# Patient Record
Sex: Male | Born: 1955 | Race: Black or African American | Hispanic: No | Marital: Married | Smoking: Former smoker
Health system: Southern US, Community
[De-identification: ages and names within clinical notes are randomized; demographics above are authoritative.]

---

## 1990-10-31 HISTORY — PX: CARPAL TUNNEL RELEASE: SHX101

## 2008-01-28 ENCOUNTER — Ambulatory Visit (HOSPITAL_COMMUNITY): Admission: RE | Admit: 2008-01-28 | Discharge: 2008-01-28 | Payer: Self-pay | Admitting: *Deleted

## 2008-02-21 ENCOUNTER — Emergency Department (HOSPITAL_COMMUNITY): Admission: EM | Admit: 2008-02-21 | Discharge: 2008-02-21 | Payer: Self-pay | Admitting: Emergency Medicine

## 2008-08-08 ENCOUNTER — Emergency Department (HOSPITAL_COMMUNITY): Admission: EM | Admit: 2008-08-08 | Discharge: 2008-08-08 | Payer: Self-pay | Admitting: Emergency Medicine

## 2008-08-10 ENCOUNTER — Emergency Department (HOSPITAL_COMMUNITY): Admission: EM | Admit: 2008-08-10 | Discharge: 2008-08-10 | Payer: Self-pay | Admitting: Emergency Medicine

## 2008-08-17 ENCOUNTER — Emergency Department (HOSPITAL_COMMUNITY): Admission: EM | Admit: 2008-08-17 | Discharge: 2008-08-17 | Payer: Self-pay | Admitting: Emergency Medicine

## 2010-10-06 ENCOUNTER — Ambulatory Visit: Payer: Self-pay | Admitting: Internal Medicine

## 2011-03-08 ENCOUNTER — Inpatient Hospital Stay (INDEPENDENT_AMBULATORY_CARE_PROVIDER_SITE_OTHER)
Admission: RE | Admit: 2011-03-08 | Discharge: 2011-03-08 | Disposition: A | Discharge status: Home / Self Care | Payer: 59 | Source: Ambulatory Visit | Attending: Family Medicine | Admitting: Family Medicine

## 2011-03-08 DIAGNOSIS — J019 Acute sinusitis, unspecified: Secondary | ICD-10-CM

## 2011-03-15 NOTE — Op Note (Signed)
NAME:  Larry Heath, Larry Heath                 ACCOUNT NO.:  [id number]   MEDICAL RECORD NO.:  [id number]          PATIENT TYPE:  AMB   LOCATION:  ENDO                         FACILITY:  [facility]   PHYSICIAN:  Georgiana Spinner, M.D.    DATE OF BIRTH:  Jan 24, 1956   DATE OF PROCEDURE:  01/28/2008  DATE OF DISCHARGE:                               OPERATIVE REPORT   PROCEDURE:  Colonoscopy.   INDICATIONS:  Colon cancer screening.   ANESTHESIA:  Fentanyl 100 mcg, Versed 10 mg.   DESCRIPTION OF THE PROCEDURE:  With the patient mildly sedated in the  left lateral decubitus position the Pentax videoscopic colonoscope was  inserted in the rectum after normal rectal exam and passed under direct  vision to the cecum identified by the ileocecal valve and appendiceal  orifice both of which were photographed.  From this point, the scope was  slowly withdrawn taking circumferential views of colonic mucosa stopping  only in the rectum which appeared normal on direct and showed small  hemorrhoids on retroflexed view.  The endoscope was straightened and  withdrawn.  The patient's vital signs and pulse oximeter remained  stable.  The patient tolerated the procedure well without apparent  complications.   FINDINGS:  Internal hemorrhoids, otherwise unremarkable exam.   PLAN:  Consider repeat examination in 5-10 years.           ______________________________  Georgiana Spinner, M.D.     GMO/MEDQ  D:  01/28/2008  T:  01/28/2008  Job:  [id number]

## 2011-07-26 LAB — CK TOTAL AND CKMB (NOT AT ARMC): Total CK: 222

## 2011-07-26 LAB — POCT CARDIAC MARKERS
Myoglobin, poc: 37.9
Myoglobin, poc: 44.7
Myoglobin, poc: 46.2
Operator id: 282201
Operator id: 285841
Troponin i, poc: <0.05

## 2011-07-26 LAB — POCT I-STAT, CHEM 8
Hemoglobin: 15.3
Sodium: 139
TCO2: 26

## 2011-11-13 ENCOUNTER — Ambulatory Visit (INDEPENDENT_AMBULATORY_CARE_PROVIDER_SITE_OTHER): Payer: 59

## 2011-11-13 DIAGNOSIS — M940 Chondrocostal junction syndrome [Tietze]: Secondary | ICD-10-CM

## 2012-05-23 ENCOUNTER — Encounter (HOSPITAL_COMMUNITY): Payer: Self-pay | Admitting: *Deleted

## 2012-05-23 ENCOUNTER — Emergency Department (HOSPITAL_COMMUNITY)
Admission: EM | Admit: 2012-05-23 | Discharge: 2012-05-23 | Disposition: A | Discharge status: Home / Self Care | Payer: 59 | Attending: Emergency Medicine | Admitting: Emergency Medicine

## 2012-05-23 ENCOUNTER — Emergency Department (HOSPITAL_COMMUNITY): Payer: 59

## 2012-05-23 DIAGNOSIS — M79609 Pain in unspecified limb: Secondary | ICD-10-CM

## 2012-05-23 DIAGNOSIS — M25569 Pain in unspecified knee: Secondary | ICD-10-CM | POA: Insufficient documentation

## 2012-05-23 DIAGNOSIS — M25461 Effusion, right knee: Secondary | ICD-10-CM

## 2012-05-23 DIAGNOSIS — M25469 Effusion, unspecified knee: Secondary | ICD-10-CM | POA: Insufficient documentation

## 2012-05-23 DIAGNOSIS — M7989 Other specified soft tissue disorders: Secondary | ICD-10-CM

## 2012-05-23 MED ORDER — TRAMADOL HCL 50 MG PO TABS: 50.0000 mg | ORAL_TABLET | Freq: Four times a day (QID) | ORAL | Status: AC | PRN

## 2012-05-23 MED ORDER — IBUPROFEN 600 MG PO TABS: 600.0000 mg | ORAL_TABLET | Freq: Four times a day (QID) | ORAL | Status: AC | PRN

## 2012-05-23 NOTE — Progress Notes (Signed)
*  PRELIMINARY RESULTS* Vascular Ultrasound Right lower extremity venous duplex has been completed.  Preliminary findings: Right= No evidence of DVT. A loculated baker's cyst composed of mixed echoes is seen in the popliteal fossa, and it appears to have a small leak into the proximal calf.  Riley Lam, Jaclyn Carew RDMS 05/23/2012, 9:14 AM

## 2012-05-23 NOTE — ED Provider Notes (Signed)
History     CSN: [id number]  Arrival date & time 05/23/12  1478   First MD Initiated Contact with Patient 05/23/12 0818      Chief Complaint  Patient presents with  . Leg Pain    (Consider location/radiation/quality/duration/timing/severity/associated sxs/prior treatment) HPI  56 year old male presents complaining of pain to right leg. Sts for the past 2 days he has been noticing swelling to the back of his R knee along with intermittent sharp, achy pain to R knee/thigh.  Pain only lasting seconds, worsening with walking and stretching, and resolved on its own.  Does not recall any recent trauma or prior injury to same area.  Does admits to having long trip travel to [place] 1 week ago.  Denies cp, sob, hemoptysis or rash.  Denies weakness or numbness.  No prior hx of DVT.  Has tried applying heat compress last night with minimal improvement.  Pt does admits to play rec basketball.    No past medical history on file.  No past surgical history on file.  No family history on file.  History  Substance Use Topics  . Smoking status: Not on file  . Smokeless tobacco: Not on file  . Alcohol Use: Not on file      Review of Systems  Constitutional: Negative for fever.  Respiratory: Negative for cough and shortness of breath.   Musculoskeletal: Positive for joint swelling.  Skin: Negative for rash.  Neurological: Negative for numbness.  All other systems reviewed and are negative.    Allergies  Review of patient's allergies indicates not on file.  Home Medications  No current outpatient prescriptions on file.  There were no vitals taken for this visit.  Physical Exam  Nursing note and vitals reviewed. Constitutional: He appears well-developed and well-nourished. No distress.  HENT:  Head: Atraumatic.  Eyes: Conjunctivae are normal.  Neck: Neck supple.  Cardiovascular: Normal rate and regular rhythm.   Pulmonary/Chest: Effort normal and breath sounds normal. No  respiratory distress. He has no wheezes.  Musculoskeletal: Normal range of motion. He exhibits no edema.       Right hip: Normal.       Right knee: He exhibits normal range of motion, no swelling, no effusion and no erythema. tenderness found.       Legs:      No palpable cords, erythema, or rash noted bilaterally to lower extremities  Neurological: He is alert.  Skin: Skin is warm. No rash noted.    ED Course  Procedures (including critical care time)  Labs Reviewed - No data to display  Dg Knee Complete 4 Views Right  05/23/2012  *RADIOLOGY REPORT*  Clinical Data: Leg pain.  RIGHT KNEE - COMPLETE 4+ VIEW  Comparison: No priors.  Findings: There is a suprapatellar effusion.  No acute fracture, subluxation or dislocation.  Mild chondrocalcinosis is noted, most pronounced in the medial meniscus.  Enthesophytes are noted on the patella.  IMPRESSION: 1.  Suprapatellar effusion, without evidence of underlying bony trauma. 2.  Mild chondrocalcinosis.  Original Report Authenticated By: Florencia Reasons, M.D.    Shashank, Kwasnik  Male  1956/05/13  [id number]            Progress Notes signed by Glendale Chard at 05/23/12 6578     Author:  Glendale Chard  Service:  Vascular Lab  Author Type:  Cardiovascular Sonographer   Filed:  05/23/12 0916  Note Time:  05/23/12 0914          *  PRELIMINARY RESULTS*  Vascular Ultrasound  Right lower extremity venous duplex has been completed. Preliminary findings: Right= No evidence of DVT. A loculated baker's cyst composed of mixed echoes is seen in the popliteal fossa, and it appears to have a small leak into the proximal calf.  EUNICE, JILL RDMS  05/23/2012, 9:14 AM     1. Right knee effusion  MDM  Vague tenderness to R posterior fossa.  Recent long trip.  Plan to obtain xray to evaluate structure, and vascular study to r/o DVT.    No evidence of infection, dislocation, fb.     9:19 AM The study shows evidence of a Baker's cyst but no evidence  of DVT.  Xray of R knee shows suprapatellar effusion with no acute fracture.  My anticipation is pt may have injured his R knee while playing basketball. Will give knee sleeve, and f/u with ortho for further care.  Pain medication prescribe, RICE therapy discussed.       Fayrene Helper, PA-C 05/23/12 0930

## 2012-05-23 NOTE — ED Notes (Signed)
Venous doppler in progress.

## 2012-05-23 NOTE — ED Notes (Signed)
Pt reports pain behind his RLE right below his knee, and presents with a swollen area on the side of his R knee as well.  Pt ambulated to the room with a limp. Pt reports pain only when ambulating.  Pt reports applying heat compress to area last night.  Pt also reports driving to [place] about a week ago.  Pt reports noticing the pain Sunday.  Pt denies any SOB or cp at this time.

## 2012-05-23 NOTE — ED Provider Notes (Signed)
Medical screening examination/treatment/procedure(s) were performed by non-physician practitioner and as supervising physician I was immediately available for consultation/collaboration.   Susie Ehresman A Johathon Overturf, MD 05/23/12 1515 

## 2015-07-07 ENCOUNTER — Emergency Department (HOSPITAL_COMMUNITY): Payer: Self-pay

## 2015-07-07 ENCOUNTER — Encounter (HOSPITAL_COMMUNITY): Payer: Self-pay | Admitting: Emergency Medicine

## 2015-07-07 ENCOUNTER — Emergency Department (HOSPITAL_COMMUNITY)
Admission: EM | Admit: 2015-07-07 | Discharge: 2015-07-07 | Disposition: A | Discharge status: Home / Self Care | Payer: Self-pay | Attending: Emergency Medicine | Admitting: Emergency Medicine

## 2015-07-07 DIAGNOSIS — R160 Hepatomegaly, not elsewhere classified: Secondary | ICD-10-CM | POA: Insufficient documentation

## 2015-07-07 DIAGNOSIS — K625 Hemorrhage of anus and rectum: Secondary | ICD-10-CM | POA: Insufficient documentation

## 2015-07-07 DIAGNOSIS — K644 Residual hemorrhoidal skin tags: Secondary | ICD-10-CM | POA: Insufficient documentation

## 2015-07-07 DIAGNOSIS — Z79899 Other long term (current) drug therapy: Secondary | ICD-10-CM | POA: Insufficient documentation

## 2015-07-07 DIAGNOSIS — K5900 Constipation, unspecified: Secondary | ICD-10-CM | POA: Insufficient documentation

## 2015-07-07 DIAGNOSIS — R1010 Upper abdominal pain, unspecified: Secondary | ICD-10-CM

## 2015-07-07 LAB — COMPREHENSIVE METABOLIC PANEL
ALBUMIN: 4.2 g/dL (ref 3.5–5.0)
ALK PHOS: 83 U/L (ref 38–126)
ALT: 16 U/L — ABNORMAL LOW (ref 17–63)
AST: 18 U/L (ref 15–41)
Anion gap: 8 (ref 5–15)
BILIRUBIN TOTAL: 0.8 mg/dL (ref 0.3–1.2)
BUN: 10 mg/dL (ref 6–20)
CALCIUM: 9.2 mg/dL (ref 8.9–10.3)
CO2: 27 mmol/L (ref 22–32)
Chloride: 98 mmol/L — ABNORMAL LOW (ref 101–111)
Creatinine, Ser: 1.04 mg/dL (ref 0.61–1.24)
GFR calc Af Amer: >60 mL/min (ref >60)
GLUCOSE: 142 mg/dL — AB (ref 65–99)
Potassium: 3.9 mmol/L (ref 3.5–5.1)
Sodium: 133 mmol/L — ABNORMAL LOW (ref 135–145)
TOTAL PROTEIN: 7.8 g/dL (ref 6.5–8.1)

## 2015-07-07 LAB — CBC WITH DIFFERENTIAL/PLATELET
BASOS PCT: 0 % (ref 0–1)
Basophils Absolute: 0 10*3/uL (ref 0.0–0.1)
EOS ABS: 0.1 10*3/uL (ref 0.0–0.7)
EOS PCT: 1 % (ref 0–5)
HCT: 42.2 % (ref 39.0–52.0)
Hemoglobin: 14.1 g/dL (ref 13.0–17.0)
Lymphocytes Relative: 23 % (ref 12–46)
Lymphs Abs: 1.4 10*3/uL (ref 0.7–4.0)
MCH: 31.3 pg (ref 26.0–34.0)
MCHC: 33.4 g/dL (ref 30.0–36.0)
MCV: 93.8 fL (ref 78.0–100.0)
MONO ABS: 0.6 10*3/uL (ref 0.1–1.0)
MONOS PCT: 9 % (ref 3–12)
Neutro Abs: 4.2 10*3/uL (ref 1.7–7.7)
Neutrophils Relative %: 67 % (ref 43–77)
PLATELETS: 252 10*3/uL (ref 150–400)
RBC: 4.5 MIL/uL (ref 4.22–5.81)
RDW: 11.7 % (ref 11.5–15.5)
WBC: 6.3 10*3/uL (ref 4.0–10.5)

## 2015-07-07 LAB — POC OCCULT BLOOD, ED: FECAL OCCULT BLD: NEGATIVE

## 2015-07-07 LAB — LIPASE, BLOOD: Lipase: 18 U/L — ABNORMAL LOW (ref 22–51)

## 2015-07-07 MED ORDER — PANTOPRAZOLE SODIUM 40 MG PO TBEC: 40.0000 mg | DELAYED_RELEASE_TABLET | Freq: Two times a day (BID) | ORAL | Status: DC

## 2015-07-07 MED ORDER — SODIUM CHLORIDE 0.9 % IV BOLUS (SEPSIS)
1000.0000 mL | Freq: Once | INTRAVENOUS | Status: AC
  Administered 2015-07-07: 1000 mL via INTRAVENOUS

## 2015-07-07 NOTE — Discharge Instructions (Signed)
The Ultrasound shows a mass in your liver. This could be from "fatty liver" or could be a cancerous mass. You need an MRI of your abdomen to tell for sure. Call your Primary Care Physician as soon as possible to set this up

## 2015-07-07 NOTE — ED Notes (Signed)
Pt reports abdominal pain (mid, right upper) starting Wednesday. Says pain is "constant, throbbing and sharp." Still has gallbladder. Denies N/V/D/fever. Says his stools have been "like little drops recently" which is abnormal and there has been some blood noted in stool. Has hemorrhoids. No other c/c.

## 2015-07-07 NOTE — ED Provider Notes (Signed)
CSN: [id number]     Arrival date & time 07/07/15  0865 History   First MD Initiated Contact with Patient 07/07/15 [id number]     Chief Complaint  Patient presents with  . Abdominal Pain  . Blood In Stools     (Consider location/radiation/quality/duration/timing/severity/associated sxs/prior Treatment) HPI  59 year old male presents with 5 days of upper abdominal pain. States it started shortly after eating 5 days ago and has since been constant. Pain is right upper quadrant and epigastric. Pain is a constant throbbing and aching but gets worse about 30 minutes after eating. No matter what he eats or drinks makes it worse. Has not had any diarrhea but has been having small stools. He thought it was gas so he started on magnesium citrate but states there is no help in his pain. Then started taking kaopectate and since then the pain has significantly improved. Rates pain as a 1/10 now. No nausea, vomiting, or fevers. No back pain or urinary symptoms. Has a history of hemorrhoids. No rectal pain, but does endorse "drops of blood" in the toilet after BMs since onset of his pain. Drinks about 3 16 oz beers daily. Last had a colonoscopy 7 years ago and was told it was normal.  History reviewed. No pertinent past medical history. History reviewed. No pertinent past surgical history. History reviewed. No pertinent family history. Social History  Substance Use Topics  . Smoking status: Never Smoker   . Smokeless tobacco: None  . Alcohol Use: Yes     Comment: occa    Review of Systems  Constitutional: Negative for fever.  Gastrointestinal: Positive for abdominal pain, constipation and blood in stool. Negative for nausea, vomiting and diarrhea.  Musculoskeletal: Negative for back pain.  All other systems reviewed and are negative.     Allergies  Review of patient's allergies indicates no known allergies.  Home Medications   Prior to Admission medications   Medication Sig Start Date End Date  Taking? Authorizing Provider  bismuth subsalicylate (PEPTO BISMOL) 262 MG/15ML suspension Take 60 mLs by mouth every 6 (six) hours as needed for indigestion.   Yes Historical Provider, MD  calcium carbonate (TUMS - DOSED IN MG ELEMENTAL CALCIUM) 500 MG chewable tablet Chew 1 tablet by mouth daily.   Yes Historical Provider, MD  ranitidine (ZANTAC) 75 MG tablet Take 75 mg by mouth 2 (two) times daily.   Yes Historical Provider, MD   BP 151/92 mmHg  Pulse 112  Temp(Src) 98.3 F (36.8 C) (Oral)  Resp 20  SpO2 97% Physical Exam  Constitutional: He is oriented to person, place, and time. He appears well-developed and well-nourished.  HENT:  Head: Normocephalic and atraumatic.  Right Ear: External ear normal.  Left Ear: External ear normal.  Nose: Nose normal.  Eyes: Right eye exhibits no discharge. Left eye exhibits no discharge.  Neck: Neck supple.  Cardiovascular: Normal rate, regular rhythm, normal heart sounds and intact distal pulses.   Pulmonary/Chest: Effort normal and breath sounds normal.  Abdominal: Soft. There is tenderness in the right upper quadrant and epigastric area. There is negative Murphy's sign.  Genitourinary: Rectal exam shows external hemorrhoid (small nonthrombosed hemorrhoids). Rectal exam shows no internal hemorrhoid, no tenderness and anal tone normal. Guaiac negative stool.  Musculoskeletal: He exhibits no edema.  Neurological: He is alert and oriented to person, place, and time.  Skin: Skin is warm and dry.  Nursing note and vitals reviewed.   ED Course  Procedures (including critical care time) Labs Review  Labs Reviewed  COMPREHENSIVE METABOLIC PANEL - Abnormal; Notable for the following:    Sodium 133 (*)    Chloride 98 (*)    Glucose, Bld 142 (*)    ALT 16 (*)    All other components within normal limits  LIPASE, BLOOD - Abnormal; Notable for the following:    Lipase 18 (*)    All other components within normal limits  CBC WITH  DIFFERENTIAL/PLATELET  POC OCCULT BLOOD, ED    Imaging Review US Abdomen Limited  07/07/2015   CLINICAL DATA:  Right upper quadrant pain, epigastric pain  EXAM: US ABDOMEN LIMITED - RIGHT UPPER QUADRANT  COMPARISON:  None.  FINDINGS: Gallbladder:  No gallstones or wall thickening visualized. No sonographic Murphy sign noted.  Common bile duct:  Diameter: 3.3 mm  Liver:  There is a 4.3 x 2.5 x 3.2 cm hypoechoic masslike area in the left hepatic lobe. Increased hepatic parenchymal echogenicity.  IMPRESSION: 1. No cholelithiasis or sonographic evidence of acute cholecystitis. 2. Increased hepatic echogenicity as can be seen with hepatic steatosis. 4.3 x 2.5 x 3.2 cm hypoechoic masslike area in the left hepatic lobe which may reflect an area of focal fatty sparing versus a true hepatic mass. Further evaluation with a nonemergent MRI of the abdomen is recommended for more definitive characterization.   Electronically Signed   By: Elige Ko   On: 07/07/2015 08:49   I have personally reviewed and evaluated these images and lab results as part of my medical decision-making.   EKG Interpretation None      MDM   Final diagnoses:  Upper abdominal pain  Liver mass  BRBPR (bright red blood per rectum)    Patient with only mild upper abdominal pain at this time. Most likely is from a gastric source such as gastritis or an ulcer. He does have bloody stools but no anemia and no rectal bleeding on my exam. Tachycardic on triage but on recheck his heart rate is in the 80s. He has no signs of significant dehydration. Ultrasound shows no gallbladder etiology but does show a hypoechoic mass. I discussed the possible etiologies with the patient and stressed the importance of getting an MRI of his abdomen as an outpatient with his PCP. Will start on PPI and discussed return precautions.    Pricilla Loveless, MD 07/07/15 [phone]

## 2016-06-03 IMAGING — US US ABDOMEN LIMITED
1 series · 14 of 25 positions shown · non-contrast
Comparison: None.

CLINICAL DATA: Right upper quadrant pain, epigastric pain

EXAM:
US ABDOMEN LIMITED - RIGHT UPPER QUADRANT

[Series 1: us abdomen limited · 0.25mm/px · 14 of 115 slices shown]
[im 1/115]
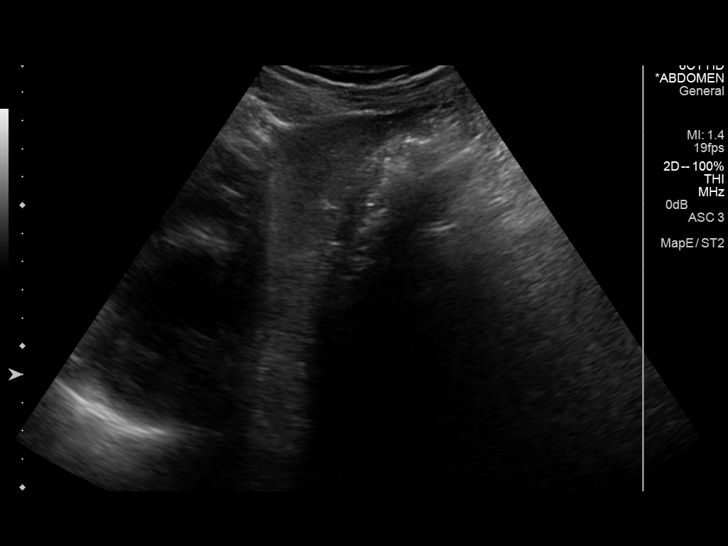
[im 10/115]
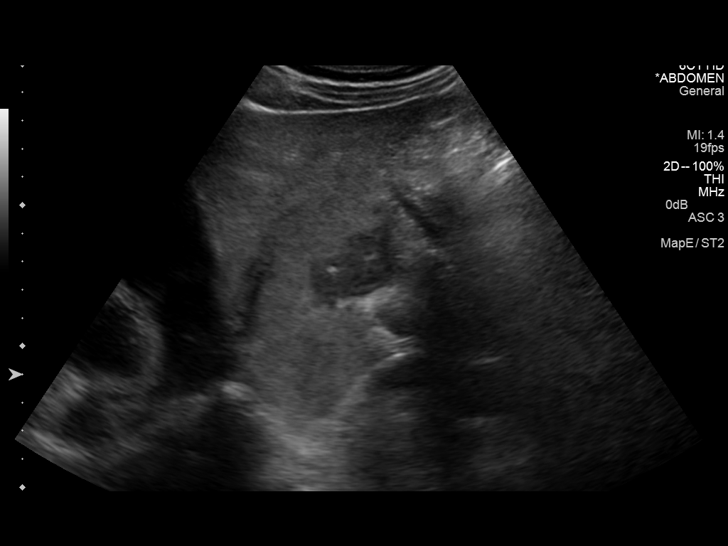
[im 20/115]
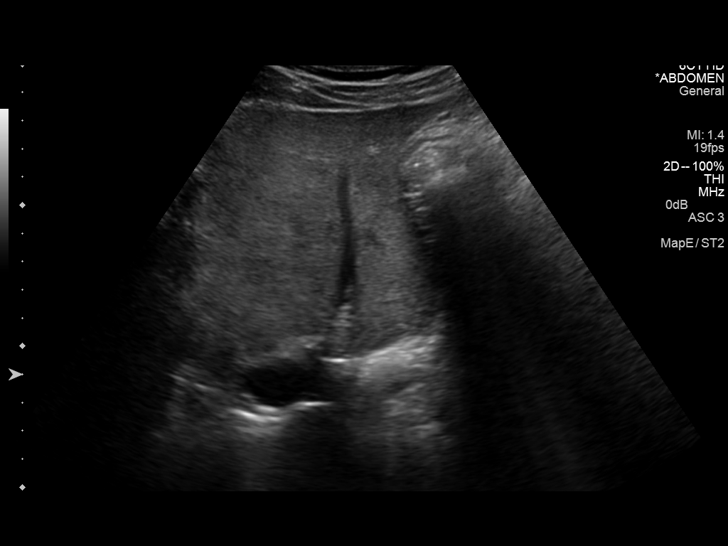
[im 29/115]
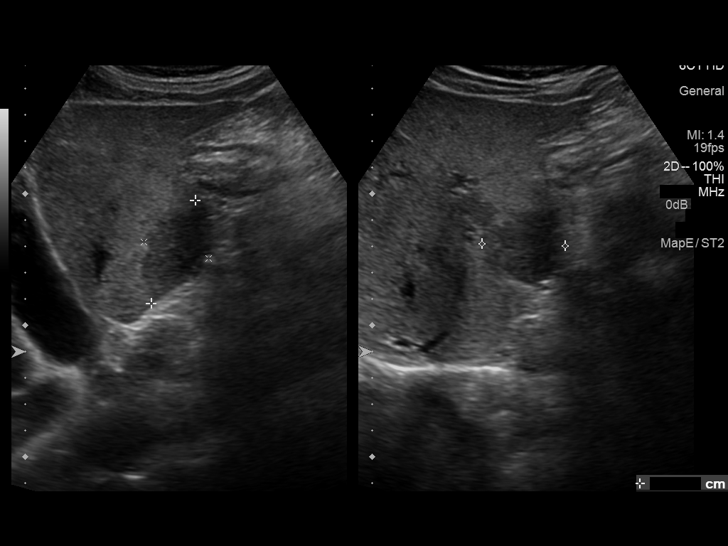
[im 39/115]
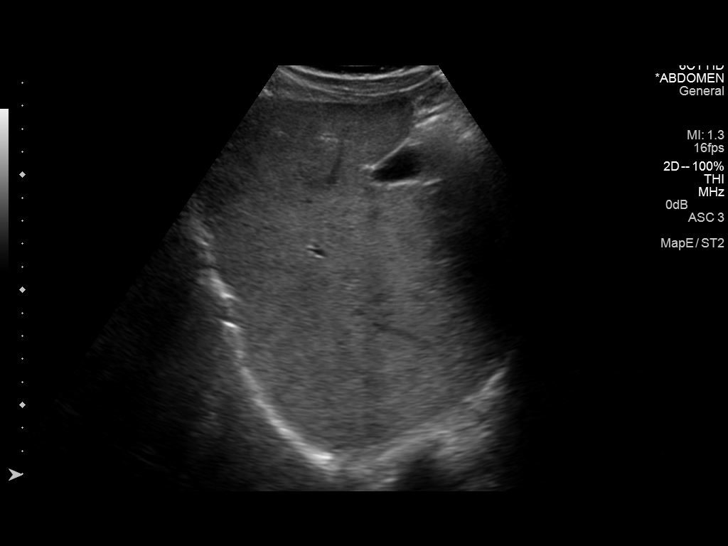
[im 43/115]
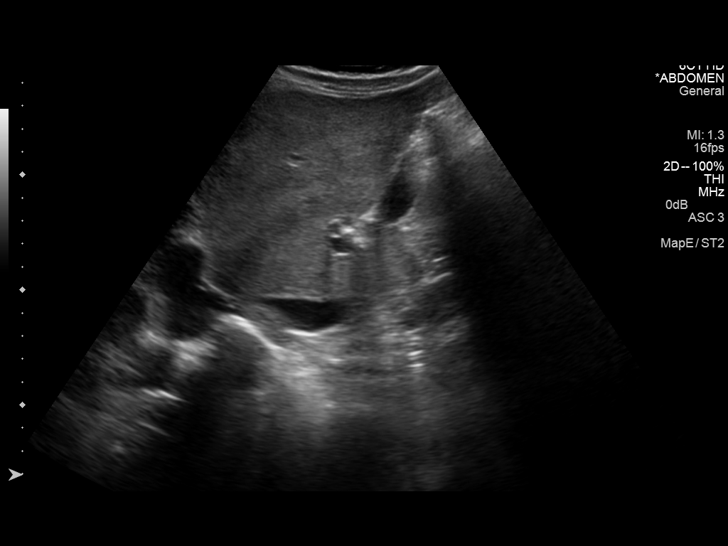
[im 53/115]
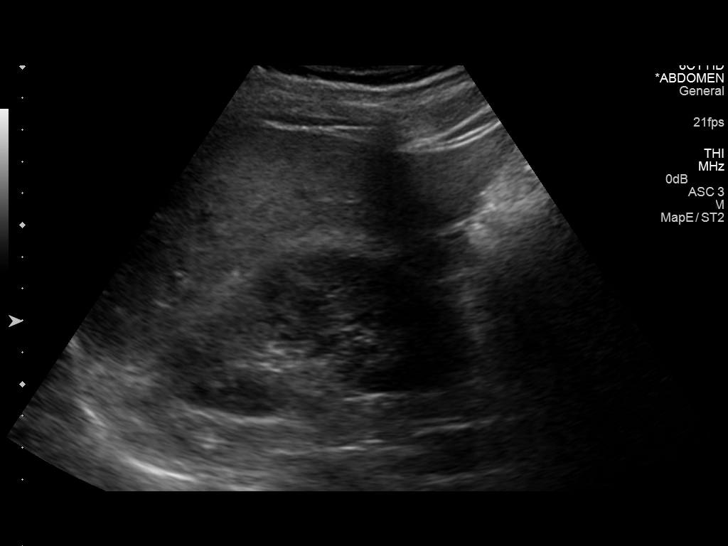
[im 62/115]
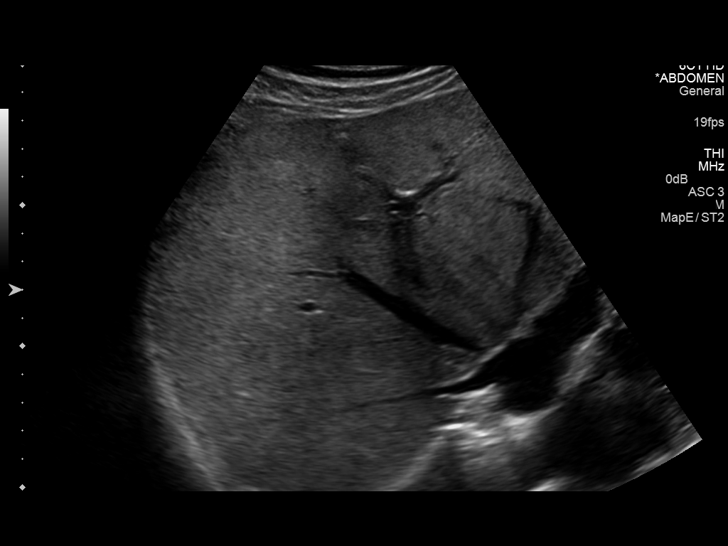
[im 72/115]
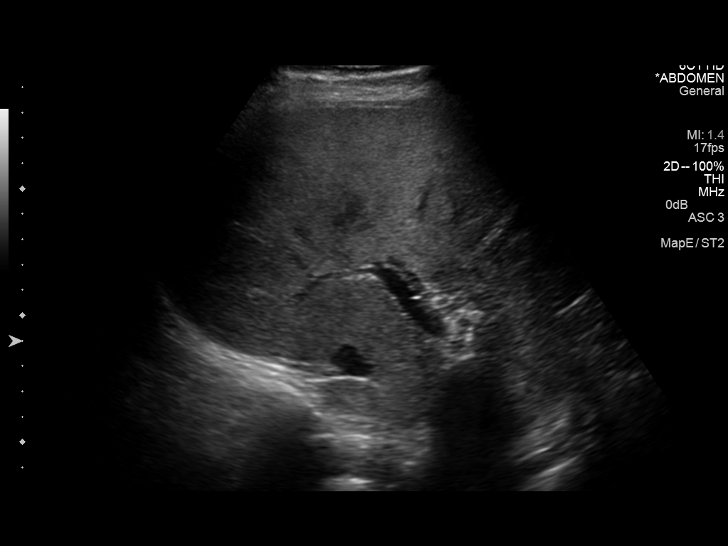
[im 77/115]
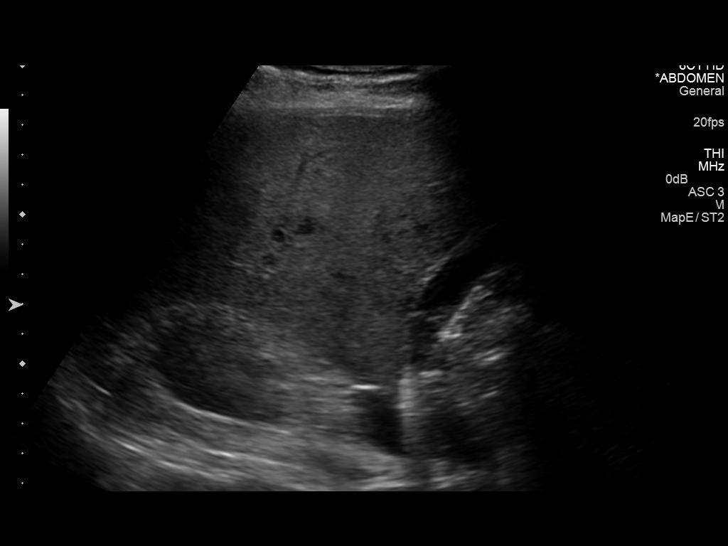
[im 86/115]
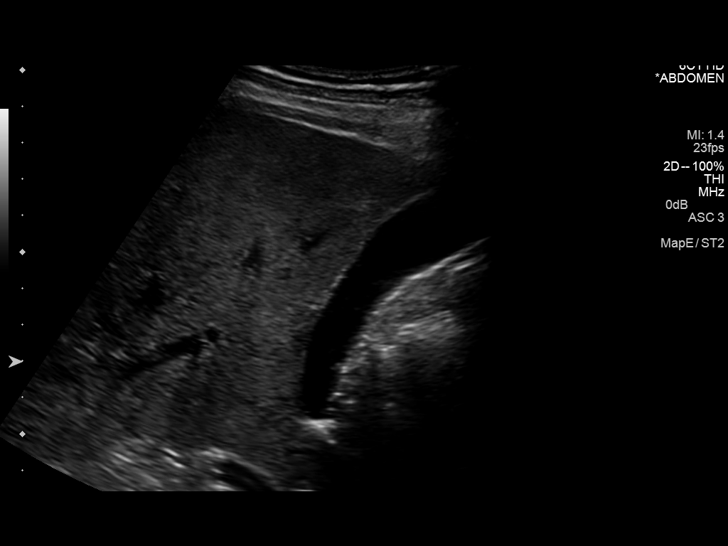
[im 96/115]
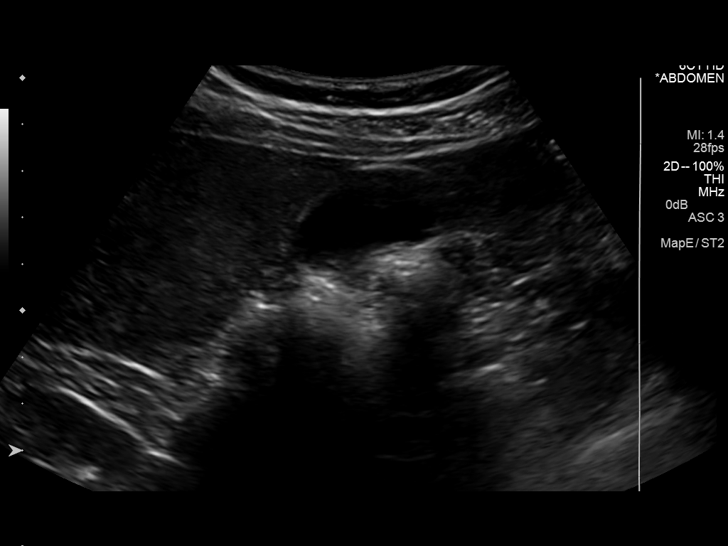
[im 105/115]
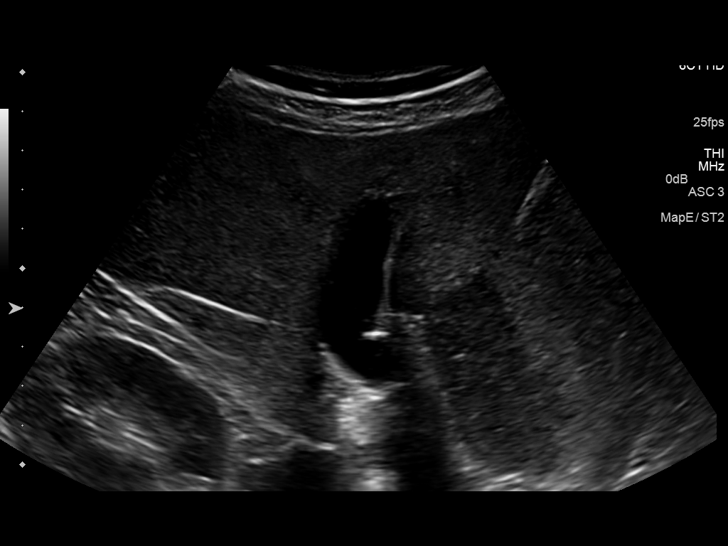
[im 115/115]
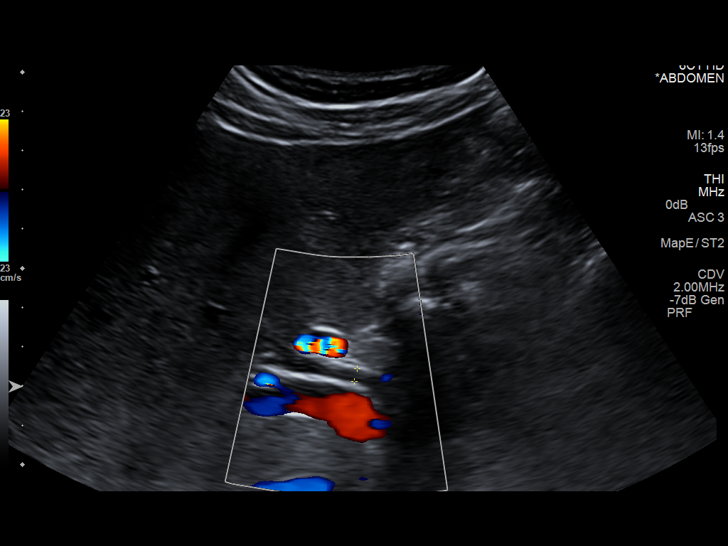

[14 of 25 positions shown; findings below may reference images not displayed]

FINDINGS: Gallbladder:

No gallstones or wall thickening visualized. No sonographic Murphy
sign noted.

Common bile duct:

Diameter: 3.3 mm

Liver:

There is a 4.3 x 2.5 x 3.2 cm hypoechoic masslike area in the left
hepatic lobe. Increased hepatic parenchymal echogenicity.
IMPRESSION: 1. No cholelithiasis or sonographic evidence of acute cholecystitis.
2. Increased hepatic echogenicity as can be seen with hepatic
steatosis. 4.3 x 2.5 x 3.2 cm hypoechoic masslike area in the left
hepatic lobe which may reflect an area of focal fatty sparing versus
a true hepatic mass. Further evaluation with a nonemergent MRI of
the abdomen is recommended for more definitive characterization.

## 2017-04-12 ENCOUNTER — Encounter: Payer: Self-pay | Admitting: Physician Assistant

## 2017-04-12 ENCOUNTER — Ambulatory Visit (INDEPENDENT_AMBULATORY_CARE_PROVIDER_SITE_OTHER): Payer: PRIVATE HEALTH INSURANCE | Admitting: Physician Assistant

## 2017-04-12 VITALS — BP 176/98 | HR 91 | Temp 98.1°F | Resp 18 | Ht 62.6 in | Wt 158.4 lb

## 2017-04-12 DIAGNOSIS — Z6827 Body mass index (BMI) 27.0-27.9, adult: Secondary | ICD-10-CM | POA: Insufficient documentation

## 2017-04-12 DIAGNOSIS — D72819 Decreased white blood cell count, unspecified: Secondary | ICD-10-CM

## 2017-04-12 DIAGNOSIS — R300 Dysuria: Secondary | ICD-10-CM | POA: Diagnosis not present

## 2017-04-12 LAB — POCT URINALYSIS DIP (MANUAL ENTRY)
BILIRUBIN UA: NEGATIVE mg/dL
Glucose, UA: NEGATIVE mg/dL
LEUKOCYTES UA: NEGATIVE
Nitrite, UA: NEGATIVE
SPEC GRAV UA: 1.02 (ref 1.010–1.025)
Urobilinogen, UA: 1 E.U./dL
pH, UA: 6 (ref 5.0–8.0)

## 2017-04-12 LAB — POCT CBC
GRANULOCYTE PERCENT: 44.8 % (ref 37–80)
HEMATOCRIT: 42.9 % — AB (ref 43.5–53.7)
Hemoglobin: 14.7 g/dL (ref 14.1–18.1)
LYMPH, POC: 1.3 (ref 0.6–3.4)
MCH, POC: 32.2 pg — AB (ref 27–31.2)
MCHC: 34.3 g/dL (ref 31.8–35.4)
MCV: 94.1 fL (ref 80–97)
MID (cbc): 0.1 (ref 0–0.9)
MPV: 7.6 fL (ref 0–99.8)
POC GRANULOCYTE: 1.2 — AB (ref 2–6.9)
POC LYMPH %: 51.6 % — AB (ref 10–50)
POC MID %: 3.6 % (ref 0–12)
Platelet Count, POC: 225 10*3/uL (ref 142–424)
RBC: 4.56 M/uL — AB (ref 4.69–6.13)
RDW, POC: 12.8 %
WBC: 2.6 10*3/uL — AB (ref 4.6–10.2)

## 2017-04-12 LAB — POC MICROSCOPIC URINALYSIS (UMFC): MUCUS RE: ABSENT

## 2017-04-12 MED ORDER — SULFAMETHOXAZOLE-TRIMETHOPRIM 800-160 MG PO TABS: 1.0000 | ORAL_TABLET | Freq: Two times a day (BID) | ORAL | 0 refills | Status: DC

## 2017-04-12 NOTE — Progress Notes (Signed)
Subjective:    Patient ID: Larry Heath, male    DOB: [birth year], 61 y.o.   MRN: [id number] PCP: Georgianne Fickamachandran, Ajith, MD   HPI : 61 y/o M presents today for a possible urinary tract infection.  His wife recently changed detergents and then had bumps on the side of his penile shaft. This occurs occasionally when the detergent is changed. He described the bumps as clusters of little "pimple like bumps". They usually break open and then he uses antibiotic cream until they resolve. About 1 month ago this occurred and he used the antibacterial cream and it took about 1.5 weeks for the bumps to resolve. About 1 week ago he developed intermittent dysuria. It mostly occurs in the morning when he wakes up. He is also having night sweats, but denies fever or chills. He had some nausea yesterday, without vomiting, diarrhea, or constipation. He denies flank and back pain. He denies any suprapubic pain. The dysuria has not gotten any better or any worse since it began. He denies hematuria, penile pain, penile discharge.  He is only having sex with his wife.    Also having some sinus problems now. Has had them his whole life. Postnasal drip. Taking an OTC [employer] brand of claritin D.    There are no active problems to display for this patient.  No past medical history on file. Prior to Admission medications   Medication Sig Start Date End Date Taking? Authorizing Provider  bismuth subsalicylate (PEPTO BISMOL) 262 MG/15ML suspension Take 60 mLs by mouth every 6 (six) hours as needed for indigestion.    [provider]  calcium carbonate (TUMS - DOSED IN MG ELEMENTAL CALCIUM) 500 MG chewable tablet Chew 1 tablet by mouth daily.    [provider]  pantoprazole (PROTONIX) 40 MG tablet Take 1 tablet (40 mg total) by mouth 2 (two) times daily. 07/07/15   Pricilla LovelessGoldston, Scott, MD  ranitidine (ZANTAC) 75 MG tablet Take 75 mg by mouth 2 (two) times daily.    [provider]   No Known  Allergies    Review of Systems  Constitutional: Positive for diaphoresis. Negative for appetite change, chills and fever.  HENT: Positive for congestion and postnasal drip.   Eyes: Negative.  Negative for visual disturbance.  Respiratory: Negative.  Negative for cough, chest tightness and shortness of breath.   Cardiovascular: Negative.  Negative for chest pain and palpitations.  Gastrointestinal: Positive for nausea. Negative for abdominal pain, blood in stool, constipation, diarrhea and vomiting.  Endocrine: Negative.  Negative for polyuria.  Genitourinary: Positive for dysuria. Negative for decreased urine volume, difficulty urinating, discharge, flank pain, frequency, hematuria, penile pain, penile swelling, scrotal swelling, testicular pain and urgency.  Musculoskeletal: Negative for back pain.  Skin: Negative.   Allergic/Immunologic: Positive for environmental allergies.  Neurological: Negative for dizziness, weakness, light-headedness and headaches.  Hematological: Negative.   Psychiatric/Behavioral: Positive for sleep disturbance. Suicidal ideas: night sweats.       Objective:   Physical Exam  Constitutional: He is oriented to person, place, and time. He appears well-developed and well-nourished. No distress.  BP (!) 174/102   Pulse 91   Temp 98.1 F (36.7 C) (Oral)   Resp 18   Ht 5' 2.6" (1.59 m)   Wt 158 lb 6.4 oz (71.8 kg)   SpO2 96%   BMI 28.42 kg/m  BP: (!) 176/98     HENT:  Head: Normocephalic and atraumatic.  Eyes: Conjunctivae and EOM are normal. Pupils are equal,  round, and reactive to light.  Neck: Normal range of motion. Neck supple.  Cardiovascular: Normal rate, regular rhythm, normal heart sounds and intact distal pulses.   Pulmonary/Chest: Effort normal and breath sounds normal.  Abdominal: Soft. He exhibits no distension and no mass. There is no tenderness. There is no rebound and no guarding.  Lymphadenopathy:    He has no cervical adenopathy.    Neurological: He is alert and oriented to person, place, and time.  Skin: Skin is warm and dry. He is not diaphoretic.  Psychiatric: He has a normal mood and affect. His behavior is normal. Judgment and thought content normal.   Results for orders placed or performed in visit on 04/12/17 (from the past 24 hour(s))  POCT urinalysis dipstick     Status: Abnormal   Collection Time: 04/12/17  9:24 AM  Result Value Ref Range   Color, UA yellow yellow   Clarity, UA cloudy (A) clear   Glucose, UA negative negative mg/dL   Bilirubin, UA small (A) negative   Ketones, POC UA negative negative mg/dL   Spec Grav, UA 6.962 9.528 - 1.025   Blood, UA moderate (A) negative   pH, UA 6.0 5.0 - 8.0   Protein Ur, POC trace (A) negative mg/dL   Urobilinogen, UA 1.0 0.2 or 1.0 E.U./dL   Nitrite, UA Negative Negative   Leukocytes, UA Negative Negative  POCT Microscopic Urinalysis ([facility])     Status: Abnormal   Collection Time: 04/12/17  9:37 AM  Result Value Ref Range   WBC,UR,HPF,POC None None WBC/hpf   RBC,UR,HPF,POC Few (A) None RBC/hpf   Bacteria Few (A) None, Too numerous to count   Mucus Absent Absent   Epithelial Cells, UR Per Microscopy Few (A) None, Too numerous to count cells/hpf  POCT CBC     Status: Abnormal   Collection Time: 04/12/17  9:38 AM  Result Value Ref Range   WBC 2.6 (A) 4.6 - 10.2 K/uL   Lymph, poc 1.3 0.6 - 3.4   POC LYMPH PERCENT 51.6 (A) 10 - 50 %L   MID (cbc) 0.1 0 - 0.9   POC MID % 3.6 0 - 12 %M   POC Granulocyte 1.2 (A) 2 - 6.9   Granulocyte percent 44.8 37 - 80 %G   RBC 4.56 (A) 4.69 - 6.13 M/uL   Hemoglobin 14.7 14.1 - 18.1 g/dL   HCT, POC 41.3 (A) 24.4 - 53.7 %   MCV 94.1 80 - 97 fL   MCH, POC 32.2 (A) 27 - 31.2 pg   MCHC 34.3 31.8 - 35.4 g/dL   RDW, POC 01.0 %   Platelet Count, POC 225 142 - 424 K/uL   MPV 7.6 0 - 99.8 fL         Assessment & Plan:  1. Dysuria Possibly due to prostatitis. Treat empirically with Bactrim. Pending culture results, will  definitively know and will adjust treatment as needed. Also possibly due to HSV or other virus causing urinary symptoms. At physical exam in 2 weeks will evaluate for HSV antibody via blood.  - POCT urinalysis dipstick - POCT Microscopic Urinalysis ([facility]) - Urine Culture - POCT CBC - sulfamethoxazole-trimethoprim (BACTRIM DS,SEPTRA DS) 800-160 MG tablet; Take 1 tablet by mouth 2 (two) times daily.  Dispense: 20 tablet; Refill: 0  2. Leukopenia, unspecified type Likely due to recent or current viral illness. Will recheck CBC at next visit in 2 weeks.   Return in about 2 weeks (around 04/26/2017) for Wellness exam  and follow-up.

## 2017-04-12 NOTE — Progress Notes (Signed)
Patient ID: Larry Heath, male     DOB: August 06, 1956, 61 y.o.    MRN: [id number]  PCP: System, Pcp Not In  Chief Complaint  Patient presents with  . Groin Pain    believes he has a bacterial infection x 3 weeks     Subjective:   This patient is new to me and presents for evaluation of possible UTI. He is accompanied by his wife.  Approximately 1 week ago, he developed intermittent dysuria, usually in the mornings upon waking. No nausea yesterday. Some nocturnal sweating, but no other fever, chills. No back pain, abdominal or flank pain. No increased urinary urgency or frequency. No hematuria. No penile discharge. No lesions of or around the genitalia, though he notes that he gets recurrent bumps on the penile shaft, always in the same location, and usually following a change in detergent or soap. They usually resolve in about 1 week. Last episode was about 1 month ago, and seemed to take a little longer to resolve. Sexually active only with his wife.  Review of Systems As above. Chronic post-nasal drainage associated with allergies. No CP, SOB, HA, dizziness.  Prior to Admission medications   Not on File     No Known Allergies   Patient Active Problem List   Diagnosis Date Noted  . BMI 28.0-28.9,adult 04/12/2017     Family History  Problem Relation Age of Onset  . Hyperlipidemia Mother   . Hypertension Father      Social History   Social History  . Marital status: Married    Spouse name: Deanna ArtisKeisha  . Number of children: 2  . Years of education: some college   Occupational History  . integration security    Social History Main Topics  . Smoking status: Never Smoker  . Smokeless tobacco: Never Used  . Alcohol use Yes     Comment: occa  . Drug use: No  . Sexual activity: Yes   Other Topics Concern  . Not on file   Social History Narrative   Former [employer].   Lives with his wife.   2 children from previous relationship are adults and live  independently.         Objective:  Physical Exam  Constitutional: He is oriented to person, place, and time. He appears well-developed and well-nourished. He is active and cooperative. No distress.  BP (!) 176/98   Pulse 91   Temp 98.1 F (36.7 C) (Oral)   Resp 18   Ht 5' 2.6" (1.59 m)   Wt 158 lb 6.4 oz (71.8 kg)   SpO2 96%   BMI 28.42 kg/m   HENT:  Head: Normocephalic and atraumatic.  Right Ear: Hearing normal.  Left Ear: Hearing normal.  Eyes: Conjunctivae are normal. No scleral icterus.  Neck: Normal range of motion. Neck supple. No thyromegaly present.  Cardiovascular: Normal rate, regular rhythm and normal heart sounds.   Pulses:      Radial pulses are 2+ on the right side, and 2+ on the left side.  Pulmonary/Chest: Effort normal and breath sounds normal.  Abdominal: Hernia confirmed negative in the right inguinal area and confirmed negative in the left inguinal area.  Genitourinary: Testes normal and penis normal. Circumcised.  Lymphadenopathy:       Head (right side): No tonsillar, no preauricular, no posterior auricular and no occipital adenopathy present.       Head (left side): No tonsillar, no preauricular, no posterior auricular and no occipital adenopathy present.  He has no cervical adenopathy.       Right: No inguinal and no supraclavicular adenopathy present.       Left: No inguinal and no supraclavicular adenopathy present.  Neurological: He is alert and oriented to person, place, and time. No sensory deficit.  Skin: Skin is warm, dry and intact. No rash noted. No cyanosis or erythema. Nails show no clubbing.  Psychiatric: He has a normal mood and affect. His speech is normal and behavior is normal.   Results for orders placed or performed in visit on 04/12/17  POCT urinalysis dipstick  Result Value Ref Range   Color, UA yellow yellow   Clarity, UA cloudy (A) clear   Glucose, UA negative negative mg/dL   Bilirubin, UA small (A) negative   Ketones,  POC UA negative negative mg/dL   Spec Grav, UA 1.610 9.604 - 1.025   Blood, UA moderate (A) negative   pH, UA 6.0 5.0 - 8.0   Protein Ur, POC trace (A) negative mg/dL   Urobilinogen, UA 1.0 0.2 or 1.0 E.U./dL   Nitrite, UA Negative Negative   Leukocytes, UA Negative Negative  POCT Microscopic Urinalysis ([facility])  Result Value Ref Range   WBC,UR,HPF,POC None None WBC/hpf   RBC,UR,HPF,POC Few (A) None RBC/hpf   Bacteria Few (A) None, Too numerous to count   Mucus Absent Absent   Epithelial Cells, UR Per Microscopy Few (A) None, Too numerous to count cells/hpf  POCT CBC  Result Value Ref Range   WBC 2.6 (A) 4.6 - 10.2 K/uL   Lymph, poc 1.3 0.6 - 3.4   POC LYMPH PERCENT 51.6 (A) 10 - 50 %L   MID (cbc) 0.1 0 - 0.9   POC MID % 3.6 0 - 12 %M   POC Granulocyte 1.2 (A) 2 - 6.9   Granulocyte percent 44.8 37 - 80 %G   RBC 4.56 (A) 4.69 - 6.13 M/uL   Hemoglobin 14.7 14.1 - 18.1 g/dL   HCT, POC 54.0 (A) 98.1 - 53.7 %   MCV 94.1 80 - 97 fL   MCH, POC 32.2 (A) 27 - 31.2 pg   MCHC 34.3 31.8 - 35.4 g/dL   RDW, POC 19.1 %   Platelet Count, POC 225 142 - 424 K/uL   MPV 7.6 0 - 99.8 fL       Assessment & Plan:  1. Dysuria Await UCx. No evidence of bacterial infection. Consider HSV. RTC next episode of penile lesions for additional evalaution. - POCT urinalysis dipstick - POCT Microscopic Urinalysis ([facility]) - Urine Culture - POCT CBC  2. Leukopenia, unspecified type Repeat at wellness visit.    Return in about 2 weeks (around 04/26/2017) for Wellness exam and follow-up.   Fernande Bras, PA-C Primary Care at [facility]

## 2017-04-12 NOTE — Patient Instructions (Signed)
     IF you received an x-ray today, you will receive an invoice from [facility]. Please contact [facility] at [phone] with questions or concerns regarding your invoice.   IF you received labwork today, you will receive an invoice from [facility]. Please contact [facility] at [phone] with questions or concerns regarding your invoice.   Our billing staff will not be able to assist you with questions regarding bills from these companies.  You will be contacted with the lab results as soon as they are available. The fastest way to get your results is to activate your My Chart account. Instructions are located on the last page of this paperwork. If you have not heard from us regarding the results in 2 weeks, please contact this office.     

## 2017-04-28 ENCOUNTER — Ambulatory Visit (INDEPENDENT_AMBULATORY_CARE_PROVIDER_SITE_OTHER): Payer: PRIVATE HEALTH INSURANCE | Admitting: Physician Assistant

## 2017-04-28 ENCOUNTER — Encounter: Payer: Self-pay | Admitting: Physician Assistant

## 2017-04-28 VITALS — BP 150/80 | HR 85 | Temp 98.0°F | Resp 18 | Ht 63.5 in | Wt 158.4 lb

## 2017-04-28 DIAGNOSIS — Z1211 Encounter for screening for malignant neoplasm of colon: Secondary | ICD-10-CM | POA: Diagnosis not present

## 2017-04-28 DIAGNOSIS — Z13220 Encounter for screening for lipoid disorders: Secondary | ICD-10-CM | POA: Diagnosis not present

## 2017-04-28 DIAGNOSIS — Z6827 Body mass index (BMI) 27.0-27.9, adult: Secondary | ICD-10-CM

## 2017-04-28 DIAGNOSIS — N489 Disorder of penis, unspecified: Secondary | ICD-10-CM | POA: Diagnosis not present

## 2017-04-28 DIAGNOSIS — Z125 Encounter for screening for malignant neoplasm of prostate: Secondary | ICD-10-CM

## 2017-04-28 DIAGNOSIS — Z23 Encounter for immunization: Secondary | ICD-10-CM | POA: Diagnosis not present

## 2017-04-28 DIAGNOSIS — Z0000 Encounter for general adult medical examination without abnormal findings: Secondary | ICD-10-CM

## 2017-04-28 DIAGNOSIS — Z13228 Encounter for screening for other metabolic disorders: Secondary | ICD-10-CM

## 2017-04-28 DIAGNOSIS — I10 Essential (primary) hypertension: Secondary | ICD-10-CM

## 2017-04-28 MED ORDER — AMLODIPINE BESYLATE 5 MG PO TABS: 5.0000 mg | ORAL_TABLET | Freq: Every day | ORAL | 3 refills | Status: AC

## 2017-04-28 NOTE — Progress Notes (Signed)
Patient ID: Larry Heath, male    DOB: [birth year], 61 y.o.   MRN: [id number]  PCP: System, Pcp Not In  Chief Complaint  Patient presents with  . Annual Exam    Subjective:   Presents for [employer].  Previous care through the [place]. Would like to start coming here. Seen earlier this month with dysuria, and related recurrent penile lesions. Would like HSV testing today.   Colorectal Cancer Screening: approximately 10 years ago, uncertain physician, possibly at [facility]. Prostate Cancer Screening: uncertain when last screened. Will obtain records from [place] system. Bone Density Testing: not yet HIV Screening: uncertain if ever screened.  STI Screening: very low risk Seasonal Influenza Vaccination: recommend annually Td/Tdap Vaccination: overdue. Pneumococcal Vaccination: not yet a candidate Zoster Vaccination: Not yet Frequency of Dental evaluation: infrequently Frequency of Eye evaluation: due for eye exam      Patient Active Problem List   Diagnosis Date Noted  . BMI 27.0-27.9,adult 04/12/2017    History reviewed. No pertinent past medical history.   Prior to Admission medications   Not on File    No Known Allergies  Past Surgical History:  Procedure Laterality Date  . CARPAL TUNNEL RELEASE Left 1992    Family History  Problem Relation Age of Onset  . Hyperlipidemia Mother   . Hypertension Mother   . Stroke Mother   . Hypertension Father   . Cancer Father 14       asbestos lung cancer    Social History   Social History  . Marital status: Married    Spouse name: Deanna Artis  . Number of children: 2  . Years of education: some college   Occupational History  . integration security    Social History Main Topics  . Smoking status: Former Smoker    Quit date: 04/28/1978  . Smokeless tobacco: Never Used  . Alcohol use Yes     Comment: 6 drinks per week  . Drug use: No  . Sexual activity: Yes   Other Topics Concern  . None   Social History  Narrative   Former Electronics engineer.   Lives with his wife.   2 children from previous relationship are adults and live independently.       Review of Systems  Constitutional: Negative.   HENT: Positive for sinus pressure ("hay fever"). Negative for congestion, dental problem, drooling, ear discharge, ear pain, facial swelling, hearing loss, mouth sores, nosebleeds, postnasal drip, rhinorrhea, sinus pain, sneezing, sore throat, tinnitus, trouble swallowing and voice change.   Eyes: Negative.   Respiratory: Negative.   Cardiovascular: Negative.   Gastrointestinal: Negative.   Endocrine: Negative.   Genitourinary: Negative.   Musculoskeletal: Negative.   Skin: Negative.   Allergic/Immunologic: Positive for environmental allergies.  Neurological: Negative.   Hematological: Negative.   Psychiatric/Behavioral: Negative.         Objective:  Physical Exam  Constitutional: He is oriented to person, place, and time. He appears well-developed and well-nourished. He is active and cooperative.  Non-toxic appearance. He does not have a sickly appearance. He does not appear ill. No distress.  BP (!) 150/80 (BP Location: Left Arm, Patient Position: Sitting, Cuff Size: Normal)   Pulse 85   Temp 98 F (36.7 C) (Oral)   Resp 18   Ht 5' 3.5" (1.613 m)   Wt 158 lb 6.4 oz (71.8 kg)   SpO2 97%   BMI 27.62 kg/m    HENT:  Head: Normocephalic and atraumatic.  Right Ear:  Hearing, tympanic membrane, external ear and ear canal normal.  Left Ear: Hearing, tympanic membrane, external ear and ear canal normal.  Nose: Nose normal.  Mouth/Throat: Uvula is midline, oropharynx is clear and moist and mucous membranes are normal. He does not have dentures. No oral lesions. No trismus in the jaw. Normal dentition. No dental abscesses, uvula swelling, lacerations or dental caries.  Eyes: Conjunctivae, EOM and lids are normal. Pupils are equal, round, and reactive to light. Right eye exhibits no discharge. Left eye  exhibits no discharge. No scleral icterus.  Fundoscopic exam:      The right eye shows no arteriolar narrowing, no AV nicking, no exudate, no hemorrhage and no papilledema.       The left eye shows no arteriolar narrowing, no AV nicking, no exudate, no hemorrhage and no papilledema.  Neck: Normal range of motion, full passive range of motion without pain and phonation normal. Neck supple. No spinous process tenderness and no muscular tenderness present. No neck rigidity. No tracheal deviation, no edema, no erythema and normal range of motion present. No thyromegaly present.  Cardiovascular: Normal rate, regular rhythm, S1 normal, S2 normal, normal heart sounds, intact distal pulses and normal pulses.  Exam reveals no gallop and no friction rub.   No murmur heard. Pulmonary/Chest: Effort normal and breath sounds normal. No respiratory distress. He has no wheezes. He has no rales.  Abdominal: Soft. Normal appearance and bowel sounds are normal. He exhibits no distension and no mass. There is no hepatosplenomegaly. There is no tenderness. There is no rebound and no guarding. No hernia.  Musculoskeletal: Normal range of motion. He exhibits no edema or tenderness.       Cervical back: Normal. He exhibits normal range of motion, no tenderness, no bony tenderness, no swelling, no edema, no deformity, no laceration, no pain, no spasm and normal pulse.       Thoracic back: Normal.       Lumbar back: Normal.  Lymphadenopathy:       Head (right side): No submental, no submandibular, no tonsillar, no preauricular, no posterior auricular and no occipital adenopathy present.       Head (left side): No submental, no submandibular, no tonsillar, no preauricular, no posterior auricular and no occipital adenopathy present.    He has no cervical adenopathy.       Right: No supraclavicular adenopathy present.       Left: No supraclavicular adenopathy present.  Neurological: He is alert and oriented to person, place,  and time. He has normal strength and normal reflexes. He displays no tremor. No cranial nerve deficit. He exhibits normal muscle tone. Coordination and gait normal.  Skin: Skin is warm, dry and intact. No abrasion, no ecchymosis, no laceration, no lesion and no rash noted. He is not diaphoretic. No cyanosis or erythema. No pallor. Nails show no clubbing.  Psychiatric: He has a normal mood and affect. His speech is normal and behavior is normal. Judgment and thought content normal. Cognition and memory are normal.           Assessment & Plan:   Problem List Items Addressed This Visit    BMI 27.0-27.9,adult    Continue active lifestyle. Healthy eating choices encouraged.      Essential hypertension    New diagnosis. Start amlodipine.      Relevant Medications   amLODipine (NORVASC) 5 MG tablet   Other Relevant Orders   CBC with Differential/Platelet (Completed)   Penile lesion  Not present today or on last exam. Desires HSV testing.      Relevant Orders   HSV(herpes simplex vrs) 1+2 ab-IgG (Completed)    Other Visit Diagnoses    Encounter for annual physical exam    -  Primary   Age appropriate health guidance provided. Will obtain records of previous care.   Screening for hyperlipidemia       Relevant Orders   Lipid panel (Completed)   Screening for metabolic disorder       Relevant Orders   Comprehensive metabolic panel (Completed)   Need for Tdap vaccination       Relevant Orders   Tdap vaccine greater than or equal to 7yo IM (Completed)   Care order/instruction: (Completed)   Special screening for malignant neoplasms, colon       Relevant Orders   Ambulatory referral to Gastroenterology   Screening for prostate cancer       Relevant Orders   PSA (Completed)       Return in about 6 weeks (around 06/09/2017) for re-evaluation of blood pressure.   Fernande Bras, PA-C Primary Care at [facility]

## 2017-04-28 NOTE — Patient Instructions (Addendum)
Being Mortal by Vivi Ferns, MD    IF you received an x-ray today, you will receive an invoice from [facility]. Please contact [facility] at [phone] with questions or concerns regarding your invoice.   IF you received labwork today, you will receive an invoice from [facility]. Please contact [facility] at [phone] with questions or concerns regarding your invoice.   Our billing staff will not be able to assist you with questions regarding bills from these companies.  You will be contacted with the lab results as soon as they are available. The fastest way to get your results is to activate your My Chart account. Instructions are located on the last page of this paperwork. If you have not heard from [place] regarding the results in 2 weeks, please contact this office.    Keeping you healthy  Get these tests  Blood pressure- Have your blood pressure checked once a year by your healthcare provider.  Normal blood pressure is 120/80  Weight- Have your body mass index (BMI) calculated to screen for obesity.  BMI is a measure of body fat based on height and weight. You can also calculate your own BMI at [email or web address].  Cholesterol- Have your cholesterol checked every year.  Diabetes- Have your blood sugar checked regularly if you have high blood pressure, high cholesterol, have a family history of diabetes or if you are overweight.  Screening for Colon Cancer- Colonoscopy starting at age 57.  Screening may begin sooner depending on your family history and other health conditions. Follow up colonoscopy as directed by your Gastroenterologist.  Screening for Prostate Cancer- Both blood work (PSA) and a rectal exam help screen for Prostate Cancer.  Screening begins at age 97 with African-American men and at age 58 with Caucasian men.  Screening may begin sooner depending on your family history.  Take these medicines  Aspirin- One aspirin daily can help prevent  Heart disease and Stroke.  Flu shot- Every fall.  Tetanus- Every 10 years.  Zostavax- Once after the age of 31 to prevent Shingles.  Pneumonia shot- Once after the age of 74; if you are younger than 29, ask your healthcare provider if you need a Pneumonia shot.  Take these steps  Don't smoke- If you do smoke, talk to your doctor about quitting.  For tips on how to quit, go to www.smokefree.gov or call 1-800-QUIT-NOW.  Be physically active- Exercise 5 days a week for at least 30 minutes.  If you are not already physically active start slow and gradually work up to 30 minutes of moderate physical activity.  Examples of moderate activity include walking briskly, mowing the yard, dancing, swimming, bicycling, etc.  Eat a healthy diet- Eat a variety of healthy food such as fruits, vegetables, low fat milk, low fat cheese, yogurt, lean meant, poultry, fish, beans, tofu, etc. For more information go to www.thenutritionsource.org  Drink alcohol in moderation- Limit alcohol intake to less than two drinks a day. Never drink and drive.  Dentist- Brush and floss twice daily; visit your dentist twice a year.  Depression- Your emotional health is as important as your physical health. If you're feeling down, or losing interest in things you would normally enjoy please talk to your healthcare provider.  Eye exam- Visit your eye doctor every year.  Safe sex- If you may be exposed to a sexually transmitted infection, use a condom.  Seat belts- Seat belts can save your life; always wear one.  Smoke/Carbon Monoxide detectors- These detectors need to  be installed on the appropriate level of your home.  Replace batteries at least once a year.  Skin cancer- When out in the sun, cover up and use sunscreen 15 SPF or higher.  Violence- If anyone is threatening you, please tell your healthcare provider. Living Will/ Health care power of attorney- Speak with your healthcare provider and family.Tdap Vaccine  (Tetanus, Diphtheria and Pertussis): What You Need to Know 1. Why get vaccinated? Tetanus, diphtheria and pertussis are very serious diseases. Tdap vaccine can protect us from these diseases. And, Tdap vaccine given to pregnant women can protect newborn babies against pertussis. TETANUS (Lockjaw) is rare in the [place] today. It causes painful muscle tightening and stiffness, usually all over the body. It can lead to tightening of muscles in the head and neck so you can't open your mouth, swallow, or sometimes even breathe. Tetanus kills about 1 out of 10 people who are infected even after receiving the best medical care.  DIPHTHERIA is also rare in the [place] today. It can cause a thick coating to form in the back of the throat. It can lead to breathing problems, heart failure, paralysis, and death.  PERTUSSIS (Whooping Cough) causes severe coughing spells, which can cause difficulty breathing, vomiting and disturbed sleep. It can also lead to weight loss, incontinence, and rib fractures. Up to 2 in 100 adolescents and 5 in 100 adults with pertussis are hospitalized or have complications, which could include pneumonia or death.  These diseases are caused by bacteria. Diphtheria and pertussis are spread from person to person through secretions from coughing or sneezing. Tetanus enters the body through cuts, scratches, or wounds. Before vaccines, as many as 200,000 cases of diphtheria, 200,000 cases of pertussis, and hundreds of cases of tetanus, were reported in the [place] each year. Since vaccination began, reports of cases for tetanus and diphtheria have dropped by about 99% and for pertussis by about 80%. 2. Tdap vaccine Tdap vaccine can protect adolescents and adults from tetanus, diphtheria, and pertussis. One dose of Tdap is routinely given at age 60 or 8012. People who did not get Tdap at that age should get it as soon as possible. Tdap is especially important for healthcare  professionals and anyone having close contact with a baby younger than 12 months. Pregnant women should get a dose of Tdap during every pregnancy, to protect the newborn from pertussis. Infants are most at risk for severe, life-threatening complications from pertussis. Another vaccine, called Td, protects against tetanus and diphtheria, but not pertussis. A Td booster should be given every 10 years. Tdap may be given as one of these boosters if you have never gotten Tdap before. Tdap may also be given after a severe cut or burn to prevent tetanus infection. Your doctor or the person giving you the vaccine can give you more information. Tdap may safely be given at the same time as other vaccines. 3. Some people should not get this vaccine A person who has ever had a life-threatening allergic reaction after a previous dose of any diphtheria, tetanus or pertussis containing vaccine, OR has a severe allergy to any part of this vaccine, should not get Tdap vaccine. Tell the person giving the vaccine about any severe allergies. Anyone who had coma or long repeated seizures within 7 days after a childhood dose of DTP or DTaP, or a previous dose of Tdap, should not get Tdap, unless a cause other than the vaccine was found. They can still get Td.  Talk to your doctor if you: have seizures or another nervous system problem, had severe pain or swelling after any vaccine containing diphtheria, tetanus or pertussis, ever had a condition called Guillain-Barr Syndrome (GBS), aren't feeling well on the day the shot is scheduled. 4. Risks With any medicine, including vaccines, there is a chance of side effects. These are usually mild and go away on their own. Serious reactions are also possible but are rare. Most people who get Tdap vaccine do not have any problems with it. Mild problems following Tdap: (Did not interfere with activities) Pain where the shot was given (about 3 in 4 adolescents or 2 in 3  adults) Redness or swelling where the shot was given (about 1 person in 5) Mild fever of at least 100.85F (up to about 1 in 25 adolescents or 1 in 100 adults) Headache (about 3 or 4 people in 10) Tiredness (about 1 person in 3 or 4) Nausea, vomiting, diarrhea, stomach ache (up to 1 in 4 adolescents or 1 in 10 adults) Chills, sore joints (about 1 person in 10) Body aches (about 1 person in 3 or 4) Rash, swollen glands (uncommon)  Moderate problems following Tdap: (Interfered with activities, but did not require medical attention) Pain where the shot was given (up to 1 in 5 or 6) Redness or swelling where the shot was given (up to about 1 in 16 adolescents or 1 in 12 adults) Fever over 102F (about 1 in 100 adolescents or 1 in 250 adults) Headache (about 1 in 7 adolescents or 1 in 10 adults) Nausea, vomiting, diarrhea, stomach ache (up to 1 or 3 people in 100) Swelling of the entire arm where the shot was given (up to about 1 in 500).  Severe problems following Tdap: (Unable to perform usual activities; required medical attention) Swelling, severe pain, bleeding and redness in the arm where the shot was given (rare).  Problems that could happen after any vaccine: People sometimes faint after a medical procedure, including vaccination. Sitting or lying down for about 15 minutes can help prevent fainting, and injuries caused by a fall. Tell your doctor if you feel dizzy, or have vision changes or ringing in the ears. Some people get severe pain in the shoulder and have difficulty moving the arm where a shot was given. This happens very rarely. Any medication can cause a severe allergic reaction. Such reactions from a vaccine are very rare, estimated at fewer than 1 in a million doses, and would happen within a few minutes to a few hours after the vaccination. As with any medicine, there is a very remote chance of a vaccine causing a serious injury or death. The safety of vaccines is always  being monitored. For more information, visit: http://floyd.org/ 5. What if there is a serious problem? What should I look for? Look for anything that concerns you, such as signs of a severe allergic reaction, very high fever, or unusual behavior. Signs of a severe allergic reaction can include hives, swelling of the face and throat, difficulty breathing, a fast heartbeat, dizziness, and weakness. These would usually start a few minutes to a few hours after the vaccination. What should I do? If you think it is a severe allergic reaction or other emergency that can't wait, call 9-1-1 or get the person to the nearest hospital. Otherwise, call your doctor. Afterward, the reaction should be reported to the Vaccine Adverse Event Reporting System (VAERS). Your doctor might file this report, or you can do  it yourself through the VAERS web site at www.vaers.LAgents.no, or by calling 1-2318060114. VAERS does not give medical advice. 6. The National Vaccine Injury Compensation Program The [employer] Vaccine Injury Compensation Program (VICP) is a federal program that was created to compensate people who may have been injured by certain vaccines. Persons who believe they may have been injured by a vaccine can learn about the program and about filing a claim by calling 1-620-723-7332 or visiting the VICP website at SpiritualWord.at. There is a time limit to file a claim for compensation. 7. How can I learn more? Ask your doctor. He or she can give you the vaccine package insert or suggest other sources of information. Call your local or state health department. Contact the Centers for Disease Control and Prevention (CDC): Call 910 872 5203 (1-800-CDC-INFO) or Visit CDC's website at PicCapture.uy CDC Tdap Vaccine VIS (12/24/13) This information is not intended to replace advice given to you by your health care provider. Make sure you discuss any questions you have with your health  care provider. Document Released: 04/17/2012 Document Revised: 07/07/2016 Document Reviewed: 07/07/2016 Elsevier Interactive Patient Education  2017 [employer].

## 2017-04-28 NOTE — Progress Notes (Signed)
Subjective:    Patient ID: Larry Heath, male    DOB: 25-Feb-1956, 61 y.o.   MRN: [id number] PCP: System, Pcp Not In Chief Complaint  Patient presents with  . Annual Exam    HPI: 61 y/o M presents today for his annual physical exam.  Feels great!  [employer] for work. Very physical. Plays basketball and thinking about getting a bike so stays active.  Sleep- 7-8 hours per night.  Diet- eats healthy, lots of vegetables and drinks water. Family reunion, [facility] in [place].  Does not see dentist regularly. Plans to make an appointment with ophthalmologist soon.   Patient Active Problem List   Diagnosis Date Noted  . BMI 28.0-28.9,adult 04/12/2017   No past medical history on file. Prior to Admission medications   Medication Sig Start Date End Date Taking? Authorizing Provider  sulfamethoxazole-trimethoprim (BACTRIM DS,SEPTRA DS) 800-160 MG tablet Take 1 tablet by mouth 2 (two) times daily. Patient not taking: Reported on 04/28/2017 04/12/17   Porfirio Oar, PA-C   No Known Allergies    Review of Systems  Constitutional: Negative for chills, fatigue and fever.  HENT: Positive for sinus pressure. Negative for congestion, postnasal drip, rhinorrhea and sore throat.   Eyes: Negative.   Respiratory: Negative.  Negative for cough, chest tightness and shortness of breath.   Cardiovascular: Negative.  Negative for chest pain, palpitations and leg swelling.  Gastrointestinal: Negative.  Negative for abdominal pain, blood in stool, constipation, diarrhea, nausea and vomiting.  Genitourinary: Negative for dysuria, frequency, hematuria and urgency.  Musculoskeletal: Negative.   Skin: Negative.   Allergic/Immunologic: Positive for environmental allergies.  Neurological: Negative.   Hematological: Negative.   Psychiatric/Behavioral: Negative.        Objective:   Physical Exam  Constitutional: He is oriented to person, place, and time. He appears well-developed and  well-nourished. No distress.  BP (!) 150/80 (BP Location: Left Arm, Patient Position: Sitting, Cuff Size: Normal)   Pulse 85   Temp 98 F (36.7 C) (Oral)   Resp 18   Ht 5' 3.5" (1.613 m)   Wt 158 lb 6.4 oz (71.8 kg)   SpO2 97%   BMI 27.62 kg/m    HENT:  Head: Normocephalic and atraumatic.  Right Ear: External ear normal.  Left Ear: External ear normal.  Nose: Nose normal.  Mouth/Throat: Oropharynx is clear and moist. No oropharyngeal exudate.  Eyes: Conjunctivae and EOM are normal. Pupils are equal, round, and reactive to light.  Neck: Normal range of motion. Neck supple. No thyromegaly present.  Cardiovascular: Normal rate, regular rhythm, normal heart sounds and intact distal pulses.   Pulmonary/Chest: Effort normal and breath sounds normal.  Lymphadenopathy:    He has no cervical adenopathy.  Neurological: He is alert and oriented to person, place, and time. He has normal reflexes.  Skin: Skin is warm and dry. He is not diaphoretic.  Psychiatric: He has a normal mood and affect. His behavior is normal. Thought content normal.      Assessment & Plan:  1. Encounter for annual physical exam Healthy adult M. Discussed age appropriate anticipatory guidance.   2. Essential hypertension Diagnosed at todays visit. Discussed lifestyle modifications including decreasing sodium intake and increasing exercise. Instructed patient to take blood pressure at home and keep a log to bring to next visit. Start patient on Norvasc 5 mg.  - CBC with Differential/Platelet - amLODipine (NORVASC) 5 MG tablet; Take 1 tablet (5 mg total) by mouth daily.  Dispense: 90  tablet; Refill: 3  3. 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  - HSV(herpes simplex vrs) 1+2 ab-IgG  4. BMI 27.0-27.9,adult Discussed improving diet and increasing exercise.   5. Screening for hyperlipidemia - Lipid panel  6. Screening for metabolic disorder - Comprehensive metabolic  panel  7. Need for Tdap vaccination - Tdap vaccine greater than or equal to 7yo IM - Care order/instruction:  8. Special screening for malignant neoplasms, colon - Ambulatory referral to Gastroenterology  9. Screening for prostate cancer - PSA  Return in about 6 weeks (around 06/09/2017) for re-evaluation of blood pressure.

## 2017-04-29 LAB — CBC WITH DIFFERENTIAL/PLATELET
BASOS: 2 %
Basophils Absolute: 0 10*3/uL (ref 0.0–0.2)
EOS (ABSOLUTE): 0.1 10*3/uL (ref 0.0–0.4)
Eos: 3 %
HEMOGLOBIN: 14 g/dL (ref 13.0–17.7)
Hematocrit: 40.9 % (ref 37.5–51.0)
IMMATURE GRANS (ABS): 0 10*3/uL (ref 0.0–0.1)
Immature Granulocytes: 0 %
LYMPHS: 60 %
Lymphocytes Absolute: 1.6 10*3/uL (ref 0.7–3.1)
MCH: 31.2 pg (ref 26.6–33.0)
MCHC: 34.2 g/dL (ref 31.5–35.7)
MCV: 91 fL (ref 79–97)
MONOCYTES: 9 %
Monocytes Absolute: 0.2 10*3/uL (ref 0.1–0.9)
NEUTROS ABS: 0.7 10*3/uL — AB (ref 1.4–7.0)
Neutrophils: 26 %
Platelets: 286 10*3/uL (ref 150–379)
RBC: 4.49 x10E6/uL (ref 4.14–5.80)
RDW: 12.1 % — ABNORMAL LOW (ref 12.3–15.4)
WBC: 2.6 10*3/uL — ABNORMAL LOW (ref 3.4–10.8)

## 2017-04-29 LAB — COMPREHENSIVE METABOLIC PANEL
ALBUMIN: 4.9 g/dL (ref 3.5–5.5)
ALT: 21 IU/L (ref 0–44)
AST: 21 IU/L (ref 0–40)
Albumin/Globulin Ratio: 1.9 (ref 1.2–2.2)
Alkaline Phosphatase: 100 IU/L (ref 39–117)
BUN / CREAT RATIO: 9 (ref 9–20)
BUN: 8 mg/dL (ref 6–24)
Bilirubin Total: 0.5 mg/dL (ref 0.0–1.2)
CO2: 25 mmol/L (ref 20–29)
CREATININE: 0.92 mg/dL (ref 0.76–1.27)
Calcium: 9.9 mg/dL (ref 8.7–10.2)
Chloride: 100 mmol/L (ref 96–106)
GFR calc Af Amer: 105 mL/min/{1.73_m2} (ref >59)
GFR, EST NON AFRICAN AMERICAN: 91 mL/min/{1.73_m2} (ref >59)
GLUCOSE: 97 mg/dL (ref 65–99)
Globulin, Total: 2.6 g/dL (ref 1.5–4.5)
Potassium: 4.9 mmol/L (ref 3.5–5.2)
Sodium: 142 mmol/L (ref 134–144)
TOTAL PROTEIN: 7.5 g/dL (ref 6.0–8.5)

## 2017-04-29 LAB — LIPID PANEL
Chol/HDL Ratio: 2.6 ratio (ref 0.0–5.0)
Cholesterol, Total: 240 mg/dL — ABNORMAL HIGH (ref 100–199)
HDL: 94 mg/dL (ref >39)
LDL CALC: 136 mg/dL — AB (ref 0–99)
Triglycerides: 48 mg/dL (ref 0–149)
VLDL CHOLESTEROL CAL: 10 mg/dL (ref 5–40)

## 2017-04-29 LAB — HSV(HERPES SIMPLEX VRS) I + II AB-IGG: HSV 2 Glycoprotein G Ab, IgG: 15.4 index — ABNORMAL HIGH (ref 0.00–0.90)

## 2017-04-29 LAB — PSA: PROSTATE SPECIFIC AG, SERUM: 1.3 ng/mL (ref 0.0–4.0)

## 2017-04-30 ENCOUNTER — Encounter: Payer: Self-pay | Admitting: Physician Assistant

## 2017-04-30 DIAGNOSIS — I10 Essential (primary) hypertension: Secondary | ICD-10-CM | POA: Insufficient documentation

## 2017-04-30 DIAGNOSIS — N489 Disorder of penis, unspecified: Secondary | ICD-10-CM | POA: Insufficient documentation

## 2017-04-30 NOTE — Assessment & Plan Note (Signed)
Continue active lifestyle. Healthy eating choices encouraged.

## 2017-04-30 NOTE — Assessment & Plan Note (Signed)
Not present today or on last exam. Desires HSV testing.

## 2017-04-30 NOTE — Assessment & Plan Note (Signed)
New diagnosis. Start amlodipine.

## 2017-05-01 ENCOUNTER — Encounter: Payer: Self-pay | Admitting: Gastroenterology

## 2017-05-08 ENCOUNTER — Encounter: Payer: Self-pay | Admitting: Physician Assistant

## 2017-05-08 DIAGNOSIS — E785 Hyperlipidemia, unspecified: Secondary | ICD-10-CM | POA: Insufficient documentation

## 2017-05-08 DIAGNOSIS — B009 Herpesviral infection, unspecified: Secondary | ICD-10-CM | POA: Insufficient documentation

## 2017-06-20 ENCOUNTER — Ambulatory Visit: Payer: PRIVATE HEALTH INSURANCE | Admitting: Physician Assistant

## 2017-07-10 ENCOUNTER — Encounter: Payer: Self-pay | Admitting: Gastroenterology

## 2017-07-25 ENCOUNTER — Encounter: Payer: Self-pay | Admitting: Physician Assistant

## 2017-12-02 ENCOUNTER — Ambulatory Visit: Payer: PRIVATE HEALTH INSURANCE | Admitting: Family Medicine

## 2018-02-05 ENCOUNTER — Encounter: Payer: Self-pay | Admitting: Physician Assistant

## 2018-03-11 ENCOUNTER — Other Ambulatory Visit: Payer: Self-pay

## 2018-03-11 ENCOUNTER — Emergency Department (HOSPITAL_COMMUNITY)
Admission: EM | Admit: 2018-03-11 | Discharge: 2018-03-11 | Disposition: A | Discharge status: Home / Self Care | Payer: No Typology Code available for payment source | Attending: Emergency Medicine | Admitting: Emergency Medicine

## 2018-03-11 ENCOUNTER — Emergency Department (HOSPITAL_COMMUNITY): Payer: No Typology Code available for payment source

## 2018-03-11 DIAGNOSIS — R0789 Other chest pain: Secondary | ICD-10-CM | POA: Diagnosis not present

## 2018-03-11 DIAGNOSIS — R079 Chest pain, unspecified: Secondary | ICD-10-CM

## 2018-03-11 DIAGNOSIS — I10 Essential (primary) hypertension: Secondary | ICD-10-CM | POA: Insufficient documentation

## 2018-03-11 DIAGNOSIS — Z79899 Other long term (current) drug therapy: Secondary | ICD-10-CM | POA: Insufficient documentation

## 2018-03-11 DIAGNOSIS — Z87891 Personal history of nicotine dependence: Secondary | ICD-10-CM | POA: Diagnosis not present

## 2018-03-11 LAB — CBC WITH DIFFERENTIAL/PLATELET
Basophils Absolute: 0 10*3/uL (ref 0.0–0.1)
Basophils Relative: 0 %
Eosinophils Absolute: 0.1 10*3/uL (ref 0.0–0.7)
Eosinophils Relative: 3 %
HCT: 42.3 % (ref 39.0–52.0)
Hemoglobin: 14.1 g/dL (ref 13.0–17.0)
Lymphocytes Relative: 44 %
Lymphs Abs: 1.5 10*3/uL (ref 0.7–4.0)
MCH: 31.4 pg (ref 26.0–34.0)
MCHC: 33.3 g/dL (ref 30.0–36.0)
MCV: 94.2 fL (ref 78.0–100.0)
Monocytes Absolute: 0.4 10*3/uL (ref 0.1–1.0)
Monocytes Relative: 12 %
Neutro Abs: 1.5 10*3/uL — ABNORMAL LOW (ref 1.7–7.7)
Neutrophils Relative %: 41 %
Platelets: 224 10*3/uL (ref 150–400)
RBC: 4.49 MIL/uL (ref 4.22–5.81)
RDW: 12.3 % (ref 11.5–15.5)
WBC: 3.5 10*3/uL — ABNORMAL LOW (ref 4.0–10.5)

## 2018-03-11 LAB — COMPREHENSIVE METABOLIC PANEL
ALT: 21 U/L (ref 17–63)
AST: 29 U/L (ref 15–41)
Albumin: 4 g/dL (ref 3.5–5.0)
Alkaline Phosphatase: 86 U/L (ref 38–126)
Anion gap: 9 (ref 5–15)
BUN: 12 mg/dL (ref 6–20)
CO2: 28 mmol/L (ref 22–32)
Calcium: 9.4 mg/dL (ref 8.9–10.3)
Chloride: 104 mmol/L (ref 101–111)
Creatinine, Ser: 1.06 mg/dL (ref 0.61–1.24)
GFR calc Af Amer: >60 mL/min (ref >60)
GFR calc non Af Amer: >60 mL/min (ref >60)
Glucose, Bld: 116 mg/dL — ABNORMAL HIGH (ref 65–99)
Potassium: 4.3 mmol/L (ref 3.5–5.1)
Sodium: 141 mmol/L (ref 135–145)
Total Bilirubin: 0.7 mg/dL (ref 0.3–1.2)
Total Protein: 7.1 g/dL (ref 6.5–8.1)

## 2018-03-11 LAB — LIPASE, BLOOD: Lipase: 39 U/L (ref 11–51)

## 2018-03-11 LAB — I-STAT TROPONIN, ED
Troponin i, poc: 0 ng/mL (ref 0.00–0.08)
Troponin i, poc: 0 ng/mL (ref 0.00–0.08)

## 2018-03-11 NOTE — ED Provider Notes (Signed)
  Physical Exam  BP (!) 153/97   Pulse 86   Temp 98.6 F (37 C)   Resp (!) 23   Ht  (1.6 m)   Wt 70.3 kg (155 lb)   SpO2 98%   BMI 27.46 kg/m   Physical Exam Unchanged from initial H&P  ED Course/Procedures     Procedures  MDM  Patient is a 62 year old male who presented with chest pain that started this morning after healing over to pick something up.  His chest pain quickly resolved.  His heart score is 3.  His initial troponin was negative.  He has had no chest pain here.  Delta troponin is also negative.  I have reevaluated the patient.  He continues to be very well-appearing.  He is stable for discharge at this time with PCP follow-up.  He and his wife are in agreement with this plan.     Lennette Bihari, MD 03/11/18 1738    Charlynne Pander, MD 03/11/18 [phone]

## 2018-03-11 NOTE — ED Provider Notes (Signed)
[facility] EMERGENCY DEPARTMENT Provider Note   CSN: [id number] Arrival date & time: 03/11/18  1211     History   Chief Complaint Chief Complaint  Patient presents with  . Chest Pain    HPI Larry Heath is a 62 y.o. male with history of hypertension who presents with chest pain.  Patient reports he was at home this morning and drank a shot of vodka and a minute later he went to pick something up off the floor when he got a sharp pain in his left chest.  He reports it lasted only a couple seconds.  He reports he became anxious about the chest pain and he felt his hands start to get clammy and felt short of breath.  His wife called EMS.  By the time EMS arrived, patient was asymptomatic.  He denies any pleuritic pain.  He denies any recent long trips, known cancer, new leg pain or swelling, history of blood clots.  Patient did have an I&D surgery to his left elbow 2 weeks ago, however he has been active and not immobilized.  He reports he feels back to normal now.  He has no family history of cardiac problems.  HPI  No past medical history on file.  Patient Active Problem List   Diagnosis Date Noted  . Hyperlipidemia 05/08/2017  . 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 Essential hypertension 04/30/2017  . Penile lesion 04/30/2017  . BMI 27.0-27.9,adult 04/12/2017    Past Surgical History:  Procedure Laterality Date  . CARPAL TUNNEL RELEASE Left 1992        Home Medications    Prior to Admission medications   Medication Sig Start Date End Date Taking? Authorizing Provider  amLODipine (NORVASC) 5 MG tablet Take 1 tablet (5 mg total) by mouth daily. 04/28/17  Yes Porfirio Oar, PA-C    Family History Family History  Problem Relation Age of Onset  . Hyperlipidemia Mother   . Hypertension Mother   . Stroke Mother   . Hypertension Father   . Cancer Father 80       asbestos lung cancer    Social History Social History   Tobacco Use  . Smoking status:  Former Smoker    Last attempt to quit: 04/28/1978    Years since quitting: 39.8  . Smokeless tobacco: Never Used  Substance Use Topics  . Alcohol use: Yes    Comment: 6 drinks per week  . Drug use: No     Allergies   Patient has no known allergies.   Review of Systems Review of Systems  Constitutional: Negative for chills, diaphoresis and fever.  HENT: Negative for facial swelling and sore throat.   Respiratory: Negative for cough and shortness of breath.   Cardiovascular: Positive for chest pain.  Gastrointestinal: Negative for abdominal pain, nausea and vomiting.  Genitourinary: Negative for dysuria.  Musculoskeletal: Negative for back pain.  Skin: Negative for rash and wound.  Neurological: Negative for headaches.  Psychiatric/Behavioral: The patient is not nervous/anxious.      Physical Exam Updated Vital Signs BP (!) 153/97   Pulse 86   Temp 98.6 F (37 C)   Resp (!) 23   Ht  (1.6 m)   Wt 70.3 kg (155 lb)   SpO2 98%   BMI 27.46 kg/m   Physical Exam  Constitutional: He appears well-developed and well-nourished. No distress.  HENT:  Head: Normocephalic and atraumatic.  Mouth/Throat: Oropharynx is clear and moist. No oropharyngeal exudate.  Eyes:  Pupils are equal, round, and reactive to light. Conjunctivae are normal. Right eye exhibits no discharge. Left eye exhibits no discharge. No scleral icterus.  Neck: Normal range of motion. Neck supple. No thyromegaly present.  Cardiovascular: Normal rate, regular rhythm, normal heart sounds and intact distal pulses. Exam reveals no gallop and no friction rub.  No murmur heard. Pulmonary/Chest: Effort normal and breath sounds normal. No stridor. No respiratory distress. He has no wheezes. He has no rales. He exhibits no tenderness.  Abdominal: Soft. Bowel sounds are normal. He exhibits no distension. There is no tenderness. There is no rebound and no guarding.  Musculoskeletal: He exhibits no edema.    Lymphadenopathy:    He has no cervical adenopathy.  Neurological: He is alert. Coordination normal.  Skin: Skin is warm and dry. No rash noted. He is not diaphoretic. No pallor.  Psychiatric: He has a normal mood and affect.  Nursing note and vitals reviewed.    ED Treatments / Results  Labs (all labs ordered are listed, but only abnormal results are displayed) Labs Reviewed  COMPREHENSIVE METABOLIC PANEL - Abnormal; Notable for the following components:      Result Value   Glucose, Bld 116 (*)    All other components within normal limits  CBC WITH DIFFERENTIAL/PLATELET - Abnormal; Notable for the following components:   WBC 3.5 (*)    Neutro Abs 1.5 (*)    All other components within normal limits  LIPASE, BLOOD  I-STAT TROPONIN, ED  I-STAT TROPONIN, ED    EKG None  Radiology Dg Chest 2 View  Result Date: 03/11/2018 CLINICAL DATA:  Onset of left side chest pain today. EXAM: CHEST - 2 VIEW COMPARISON:  None. FINDINGS: Lungs clear. Heart size normal. No pneumothorax or pleural fluid. No acute bony abnormality. Thoracic spondylosis noted. IMPRESSION: No acute disease. Electronically Signed   By: Drusilla Kanner M.D.   On: 03/11/2018 13:38    Procedures Procedures (including critical care time)  Medications Ordered in ED Medications - No data to display   Initial Impression / Assessment and Plan / ED Course  I have reviewed the triage vital signs and the nursing notes.  Pertinent labs & imaging results that were available during my care of the patient were reviewed by me and considered in my medical decision making (see chart for details).     Patient with very atypical chest pain that was transient and has not returned.  Suspect pain related to bending over after drinking vodka, possibly gas pain.  CBC, CMP within normal limits for the patient.  Initial troponin is negative.  HEAR score is 3.  EKG shows NSR, RBBB, LAFB, LVH, rate 95. Low suspicion for PE or ACS,  however delta troponin pending. At shift change, patient care transferred to Dr. Charm Barges for continued evaluation, follow up of delta troponin and determination of disposition. Anticipate discharge if delta troponin negative.   Final Clinical Impressions(s) / ED Diagnoses   Final diagnoses:  Atypical chest pain    ED Discharge Orders    None       Emi Holes, PA-C 03/11/18 1552    Mesner, Barbara Cower, MD 03/11/18 1553

## 2018-03-11 NOTE — ED Triage Notes (Signed)
Per EMS patient stood up, bend over and felt left-sided chest pain. Pain resolved shortly afterwards and pain free prior to EMS arrival. 324 ASA given by EMS.

## 2020-01-14 IMAGING — DX DG CHEST 2V
2 series · 2 of 2 positions shown · non-contrast
Comparison: None.

CLINICAL DATA: Onset of left side chest pain today.

EXAM:
CHEST - 2 VIEW

[chest pa]
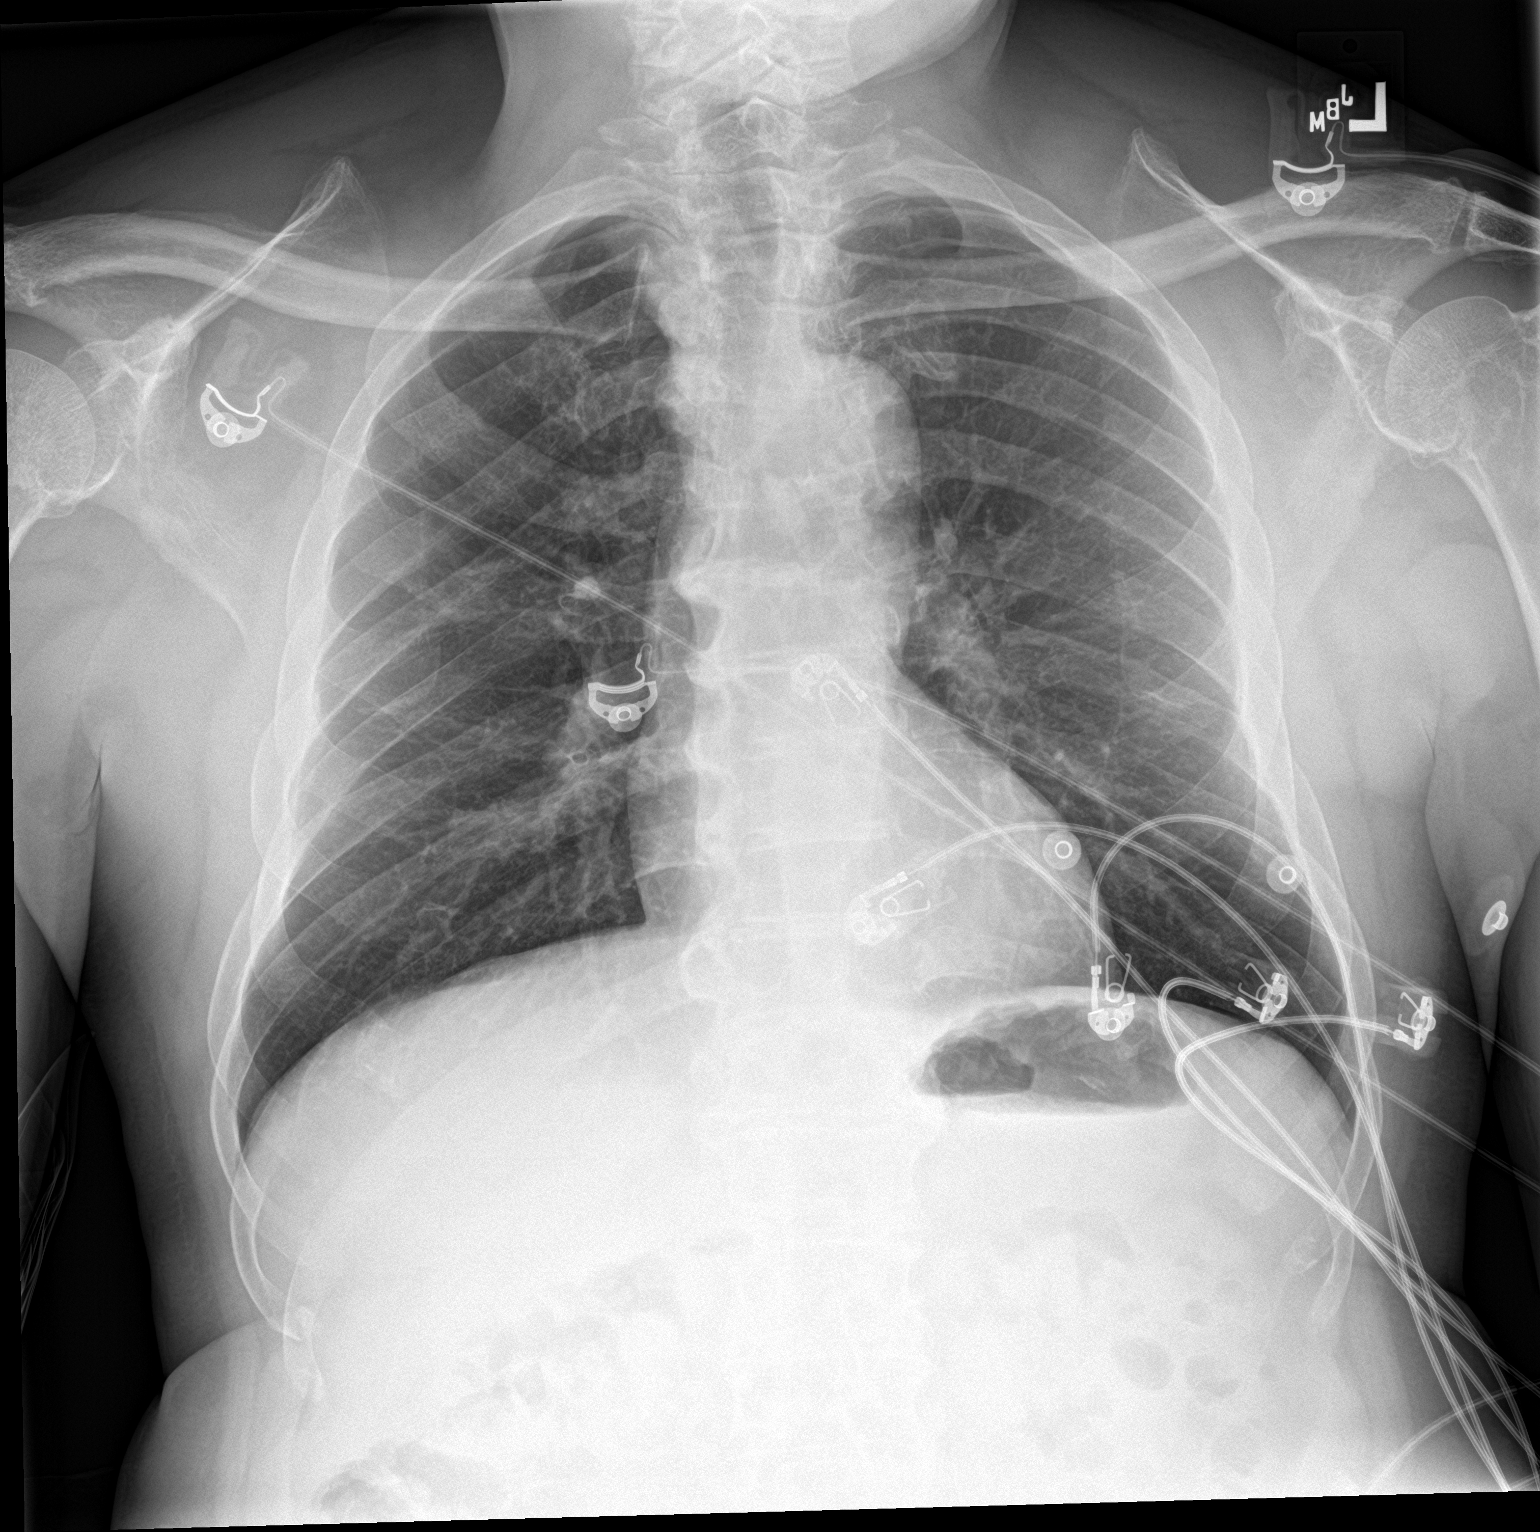

[chest lat]
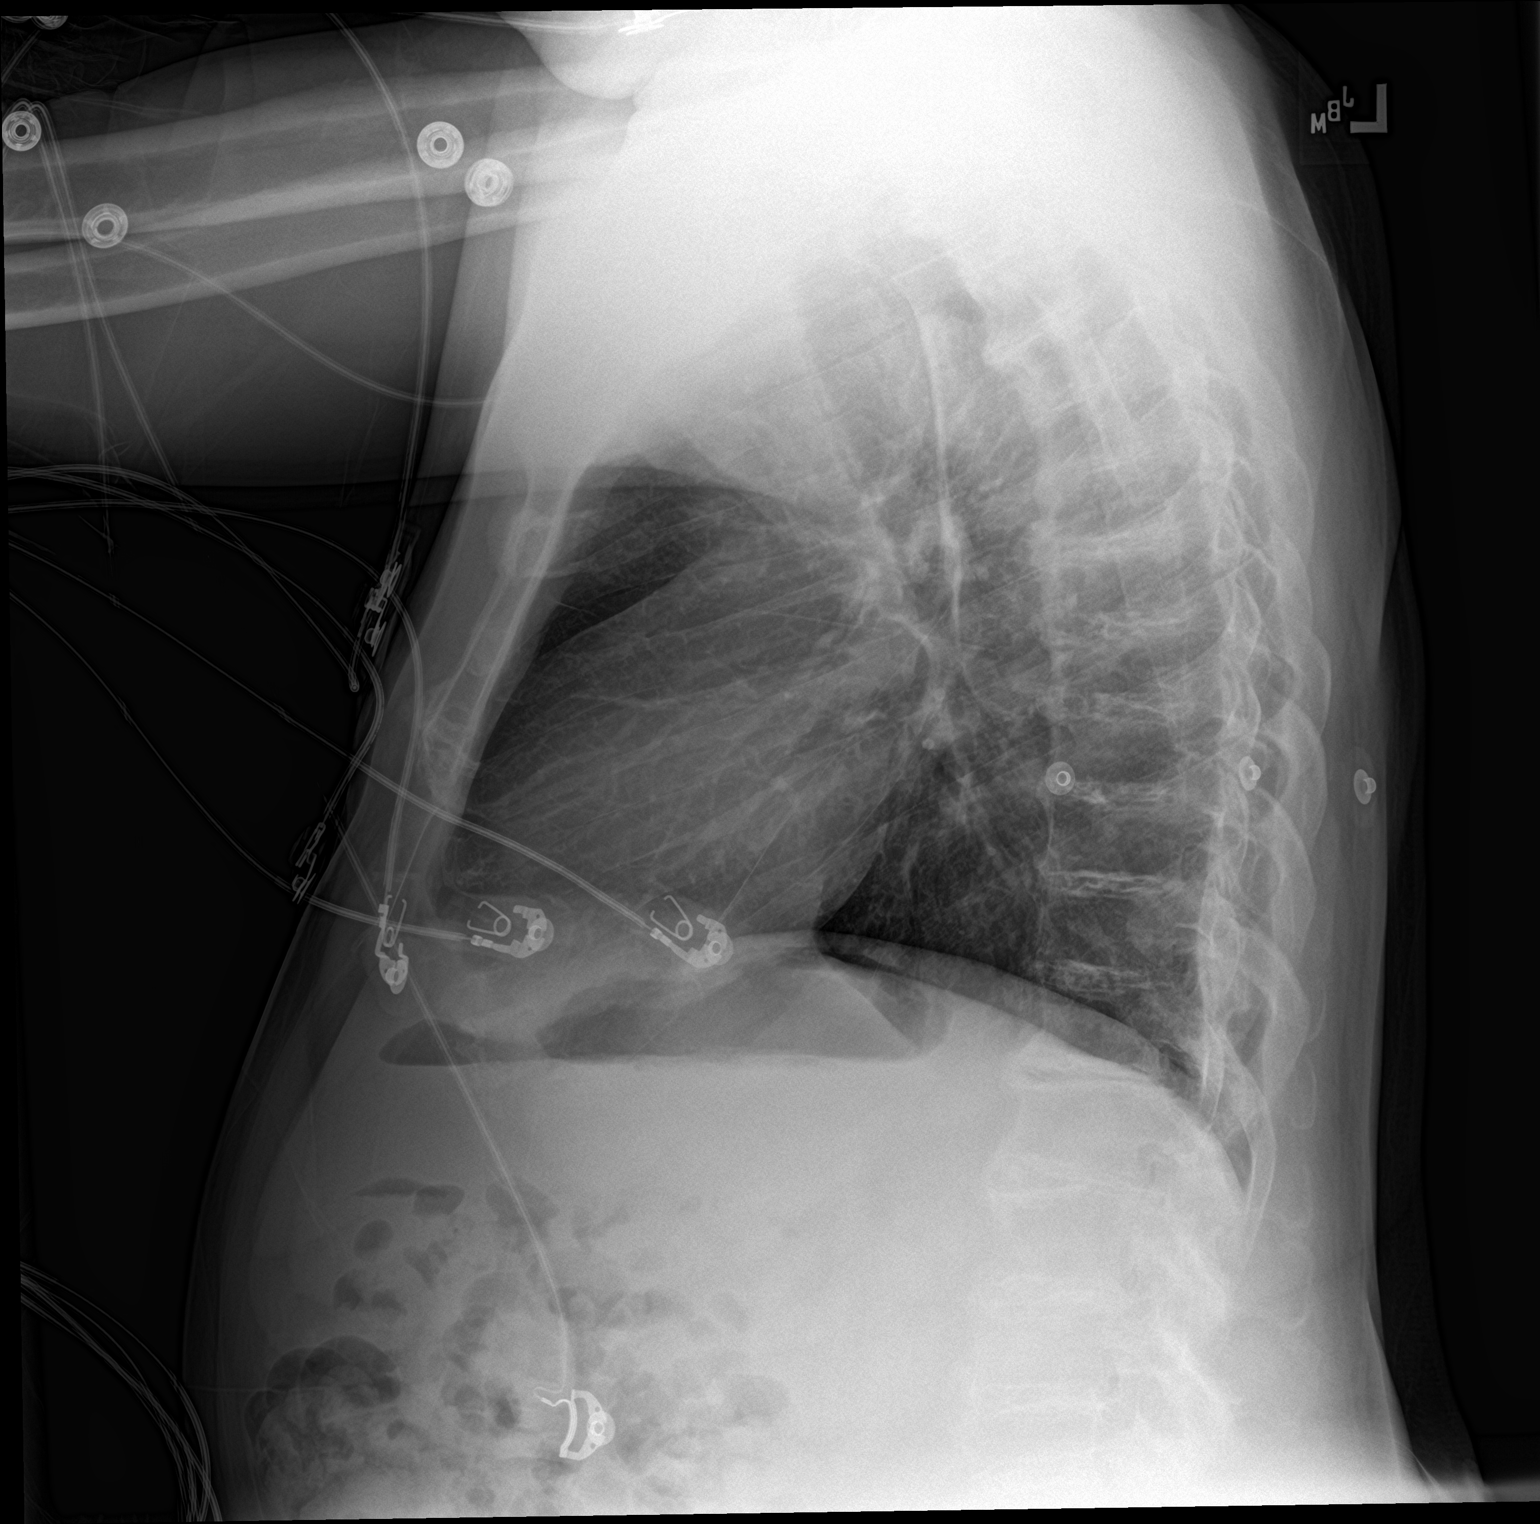

[2 of 2 positions shown; findings below may reference images not displayed]

FINDINGS: Lungs clear. Heart size normal. No pneumothorax or pleural fluid. No
acute bony abnormality. Thoracic spondylosis noted.
IMPRESSION: No acute disease.

## 2022-07-21 ENCOUNTER — Other Ambulatory Visit (HOSPITAL_COMMUNITY): Payer: Self-pay | Admitting: Family Medicine

## 2022-07-21 ENCOUNTER — Other Ambulatory Visit: Payer: Self-pay | Admitting: Family Medicine

## 2022-07-21 DIAGNOSIS — Z136 Encounter for screening for cardiovascular disorders: Secondary | ICD-10-CM

## 2022-07-25 ENCOUNTER — Ambulatory Visit (HOSPITAL_COMMUNITY)
Admission: RE | Admit: 2022-07-25 | Discharge: 2022-07-25 | Disposition: A | Discharge status: Home / Self Care | Payer: No Typology Code available for payment source | Source: Ambulatory Visit | Attending: Family Medicine | Admitting: Family Medicine

## 2022-07-25 DIAGNOSIS — Z136 Encounter for screening for cardiovascular disorders: Secondary | ICD-10-CM | POA: Diagnosis not present

## 2024-04-30 ENCOUNTER — Emergency Department (HOSPITAL_COMMUNITY)
Admission: EM | Admit: 2024-04-30 | Discharge: 2024-04-30 | Disposition: A | Discharge status: Home / Self Care | Attending: Emergency Medicine | Admitting: Emergency Medicine

## 2024-04-30 ENCOUNTER — Emergency Department (HOSPITAL_COMMUNITY)

## 2024-04-30 ENCOUNTER — Other Ambulatory Visit: Payer: Self-pay

## 2024-04-30 DIAGNOSIS — S4991XA Unspecified injury of right shoulder and upper arm, initial encounter: Secondary | ICD-10-CM | POA: Diagnosis present

## 2024-04-30 DIAGNOSIS — W108XXA Fall (on) (from) other stairs and steps, initial encounter: Secondary | ICD-10-CM | POA: Insufficient documentation

## 2024-04-30 DIAGNOSIS — S46911A Strain of unspecified muscle, fascia and tendon at shoulder and upper arm level, right arm, initial encounter: Secondary | ICD-10-CM | POA: Diagnosis not present

## 2024-04-30 MED ORDER — IBUPROFEN 600 MG PO TABS: 600.0000 mg | ORAL_TABLET | Freq: Four times a day (QID) | ORAL | 0 refills | Status: AC

## 2024-04-30 MED ORDER — METHOCARBAMOL 750 MG PO TABS: 750.0000 mg | ORAL_TABLET | Freq: Every evening | ORAL | 0 refills | Status: AC | PRN

## 2024-04-30 NOTE — ED Triage Notes (Signed)
 Patient stated that yesterday he fell down the steps taking the garbage out. Patient fell backwards and tried to catch himself and now his right shoulder is hurting him. Patient reported he did not hit his head. No other complaints at this time.

## 2024-04-30 NOTE — ED Provider Notes (Signed)
 [place] EMERGENCY DEPARTMENT AT [facility] Provider Note   CSN: [id number] Arrival date & time: 04/30/24  9444     Patient presents with: Fall and Shoulder Pain   Larry Heath is a 68 y.o. male.   Patient to ED with right shoulder pain since yesterday after falling while walking down steps. He fell backward and extended his right arm to catch his fall causing pain to the shoulder. No back pain, neck pain, head injury, or lower extremity soreness. No weakness of the right arm. No numbness.   The history is provided by the patient. No language interpreter was used.  Fall  Shoulder Pain      Prior to Admission medications   Medication Sig Start Date End Date Taking? Authorizing Provider  ibuprofen  (ADVIL ) 600 MG tablet Take 1 tablet (600 mg total) by mouth every 6 (six) hours. 04/30/24  Yes Linus Weckerly, Margit, PA-C  methocarbamol (ROBAXIN) 750 MG tablet Take 1 tablet (750 mg total) by mouth at bedtime as needed for muscle spasms. 04/30/24  Yes Danya Spearman, Margit, PA-C  amLODipine  (NORVASC ) 5 MG tablet Take 1 tablet (5 mg total) by mouth daily. 04/28/17   Juliane Che, PA    Allergies: Patient has no known allergies.    Review of Systems  Updated Vital Signs BP (!) 141/101 (BP Location: Left Arm)   Pulse 92   Temp 98.6 F (37 C) (Oral)   Resp 18   SpO2 97%   Physical Exam Constitutional:      Appearance: He is well-developed.  Neck:     Comments: No midline cervical or paracervical tenderness. Pulmonary:     Effort: Pulmonary effort is normal.   Musculoskeletal:        General: Normal range of motion.     Cervical back: Normal range of motion.     Comments: Right shoulder has no swelling. No bony deformity. ROM limited by discomfort when raising the arm over the head, but range is full. Joint stable.    Skin:    General: Skin is warm and dry.   Neurological:     Mental Status: He is alert and oriented to person, place, and time.     (all labs ordered are  listed, but only abnormal results are displayed) Labs Reviewed - No data to display  EKG: None  Radiology: DG Shoulder Right Result Date: 04/30/2024 CLINICAL DATA:  68 year old male status post fall 2 days ago, continued right shoulder pain and limited range of motion. EXAM: RIGHT SHOULDER - 2+ VIEW COMPARISON:  Chest radiographs 03/11/2018. FINDINGS: Two views at 0609 hours. Chronic degenerative spurring at the right [facility] joint, acromion, humeral head and acetabulum. On the Y-view there is no convincing glenohumeral dislocation. No acute fracture identified. Visible right ribs and chest appear stable. IMPRESSION: Degenerative changes with No acute fracture or dislocation identified about the right shoulder. Electronically Signed   By: VEAR Hurst M.D.   On: 04/30/2024 06:24     Procedures   Medications Ordered in the ED - No data to display                                  Medical Decision Making       Final diagnoses:  Strain of right shoulder, initial encounter    ED Discharge Orders          Ordered    ibuprofen  (ADVIL ) 600 MG tablet  Every 6 hours        04/30/24 0652    methocarbamol (ROBAXIN) 750 MG tablet  At bedtime PRN        04/30/24 0652               Odell Balls, PA-C 04/30/24 9342    Jerral Meth, MD 05/01/24 0000

## 2024-04-30 NOTE — Discharge Instructions (Signed)
 Follow up with orthopedics (Dr. Germaine) if pain is not improving after 4-5 days. Take ibuprofen  every 6 hours for pain and inflammation. Take Robaxin at night. Use heat as instructed.

## 2024-08-01 NOTE — Progress Notes (Unsigned)
   New Patient Pulmonology Office Visit   Subjective:  Patient ID: Larry Heath, male    DOB: 10-27-1956  MRN: [id number]  Referred by: Piva, Enrico E, DO  CC: No chief complaint on file.   HPI Larry Heath is a 68 y.o. male with *** Mucus and coughing No xray,no pulm disease   {PULM QUESTIONNAIRES (Optional):33196}  ROS  Allergies: Patient has no known allergies.  Current Outpatient Medications:    amLODipine  (NORVASC ) 5 MG tablet, Take 1 tablet (5 mg total) by mouth daily., Disp: 90 tablet, Rfl: 3   ibuprofen  (ADVIL ) 600 MG tablet, Take 1 tablet (600 mg total) by mouth every 6 (six) hours., Disp: 30 tablet, Rfl: 0   methocarbamol  (ROBAXIN ) 750 MG tablet, Take 1 tablet (750 mg total) by mouth at bedtime as needed for muscle spasms., Disp: 10 tablet, Rfl: 0 No past medical history on file. Past Surgical History:  Procedure Laterality Date   CARPAL TUNNEL RELEASE Left 1992   Family History  Problem Relation Age of Onset   Hyperlipidemia Mother    Hypertension Mother    Stroke Mother    Hypertension Father    Cancer Father 29       asbestos lung cancer   Social History   Socioeconomic History   Marital status: Married    Spouse name: Nanetta   Number of children: 2   Years of education: some college   Highest education level: Not on file  Occupational History   Occupation: Field seismologist  Tobacco Use   Smoking status: Former    Current packs/day: 0.00    Types: Cigarettes    Quit date: 04/28/1978    Years since quitting: 46.2   Smokeless tobacco: Never  Vaping Use   Vaping status: Never Used  Substance and Sexual Activity   Alcohol use: Yes    Comment: 6 drinks per week   Drug use: No   Sexual activity: Yes  Other Topics Concern   Not on file  Social History Narrative   Former Electronics engineer.   Lives with his wife.   2 children from previous relationship are adults and live independently.   Exercise: Very physical job. Plays basketball and thinking about  getting a bike so stays active.    Sleep- 7-8 hours per night.    Diet- eats healthy, lots of vegetables and drinks water.   Social Drivers of Corporate investment banker Strain: Not on file  Food Insecurity: Not on file  Transportation Needs: Not on file  Physical Activity: Not on file  Stress: Not on file  Social Connections: Not on file  Intimate Partner Violence: Not on file       Objective:  There were no vitals taken for this visit. {Pulm Vitals (Optional):32837}  Physical Exam  Diagnostic Review:  {Labs (Optional):32838}     Assessment & Plan:   Assessment & Plan    No follow-ups on file.    Marny Patch, MD Pulmonary and Critical Care Medicine [facility]

## 2024-08-02 ENCOUNTER — Ambulatory Visit (INDEPENDENT_AMBULATORY_CARE_PROVIDER_SITE_OTHER)

## 2024-08-02 VITALS — BP 142/103 | HR 92 | Temp 97.6°F | Ht 63.0 in | Wt 150.4 lb

## 2024-08-02 DIAGNOSIS — J329 Chronic sinusitis, unspecified: Secondary | ICD-10-CM

## 2024-08-02 MED ORDER — FLUTICASONE PROPIONATE 50 MCG/ACT NA SUSP: 1.0000 | Freq: Two times a day (BID) | NASAL | 6 refills | Status: AC

## 2024-08-02 MED ORDER — AZELASTINE HCL 0.1 % NA SOLN: 1.0000 | Freq: Two times a day (BID) | NASAL | Status: AC

## 2024-08-02 NOTE — Patient Instructions (Addendum)
 Dear Mr. Larry Heath;  You have chronic sinusitis and rhinitis which mean mucus production. I am glad you change to a different house without mold and your symptoms have improved. I will recommend for decrease the mucus to use the following: -Flonase nasal spray, twice a day, 1 spray each nostril for 2-3 weeks and then as need it. -Astelin spray, daily, 1 spray each nostril, for 2-3 weeks and then as need it. -Continue to use your saline lavage twice a day.  I will see you in 4 months.
